# Patient Record
Sex: Female | Born: 1960 | Race: White | Hispanic: No | Marital: Married | Smoking: Never smoker
Health system: Southern US, Community
[De-identification: ages and names within clinical notes are randomized; demographics above are authoritative.]

---

## 2000-06-01 ENCOUNTER — Encounter: Admission: RE | Admit: 2000-06-01 | Discharge: 2000-06-01 | Payer: Self-pay | Admitting: Otolaryngology

## 2000-06-01 ENCOUNTER — Encounter: Payer: Self-pay | Admitting: Otolaryngology

## 2000-12-25 ENCOUNTER — Other Ambulatory Visit: Admission: RE | Admit: 2000-12-25 | Discharge: 2000-12-25 | Payer: Self-pay | Admitting: Family Medicine

## 2002-01-09 ENCOUNTER — Other Ambulatory Visit: Admission: RE | Admit: 2002-01-09 | Discharge: 2002-01-09 | Payer: Self-pay | Admitting: Family Medicine

## 2003-02-03 ENCOUNTER — Other Ambulatory Visit: Admission: RE | Admit: 2003-02-03 | Discharge: 2003-02-03 | Payer: Self-pay | Admitting: Obstetrics and Gynecology

## 2003-02-11 ENCOUNTER — Ambulatory Visit (HOSPITAL_COMMUNITY): Admission: RE | Admit: 2003-02-11 | Discharge: 2003-02-11 | Payer: Self-pay | Admitting: Obstetrics and Gynecology

## 2003-02-11 ENCOUNTER — Encounter: Payer: Self-pay | Admitting: Obstetrics and Gynecology

## 2003-05-05 ENCOUNTER — Ambulatory Visit (HOSPITAL_COMMUNITY): Admission: RE | Admit: 2003-05-05 | Discharge: 2003-05-05 | Payer: Self-pay | Admitting: Obstetrics and Gynecology

## 2003-05-05 ENCOUNTER — Encounter (INDEPENDENT_AMBULATORY_CARE_PROVIDER_SITE_OTHER): Payer: Self-pay | Admitting: Specialist

## 2003-10-05 ENCOUNTER — Ambulatory Visit (HOSPITAL_COMMUNITY): Admission: RE | Admit: 2003-10-05 | Discharge: 2003-10-05 | Payer: Self-pay | Admitting: Surgery

## 2003-10-21 ENCOUNTER — Ambulatory Visit (HOSPITAL_COMMUNITY): Admission: RE | Admit: 2003-10-21 | Discharge: 2003-10-21 | Payer: Self-pay | Admitting: Surgery

## 2003-10-21 ENCOUNTER — Encounter (INDEPENDENT_AMBULATORY_CARE_PROVIDER_SITE_OTHER): Payer: Self-pay | Admitting: *Deleted

## 2004-04-05 ENCOUNTER — Other Ambulatory Visit: Admission: RE | Admit: 2004-04-05 | Discharge: 2004-04-05 | Payer: Self-pay | Admitting: Obstetrics and Gynecology

## 2004-10-06 ENCOUNTER — Ambulatory Visit (HOSPITAL_COMMUNITY): Admission: RE | Admit: 2004-10-06 | Discharge: 2004-10-06 | Payer: Self-pay | Admitting: Surgery

## 2005-05-18 ENCOUNTER — Other Ambulatory Visit: Admission: RE | Admit: 2005-05-18 | Discharge: 2005-05-18 | Payer: Self-pay | Admitting: Obstetrics and Gynecology

## 2007-09-06 ENCOUNTER — Ambulatory Visit (HOSPITAL_COMMUNITY): Admission: RE | Admit: 2007-09-06 | Discharge: 2007-09-06 | Payer: Self-pay | Admitting: Obstetrics and Gynecology

## 2008-02-14 ENCOUNTER — Encounter (INDEPENDENT_AMBULATORY_CARE_PROVIDER_SITE_OTHER): Payer: Self-pay | Admitting: Surgery

## 2008-02-14 ENCOUNTER — Ambulatory Visit (HOSPITAL_COMMUNITY): Admission: RE | Admit: 2008-02-14 | Discharge: 2008-02-15 | Payer: Self-pay | Admitting: Surgery

## 2009-08-18 ENCOUNTER — Encounter: Admission: RE | Admit: 2009-08-18 | Discharge: 2009-08-18 | Payer: Self-pay | Admitting: Obstetrics and Gynecology

## 2010-10-18 NOTE — Op Note (Signed)
NAMEJERRICA, [name]             ACCOUNT NO.:  [id number]   MEDICAL RECORD NO.:  [id number]          PATIENT TYPE:  OIB   LOCATION:  1523                         FACILITY:  [facility]   PHYSICIAN:  Velora Heckler, MD      DATE OF BIRTH:  [birth year]   DATE OF PROCEDURE:  02/14/2008  DATE OF DISCHARGE:  02/15/2008                               OPERATIVE REPORT   PREOPERATIVE DIAGNOSIS:  Bilateral thyroid nodules.   POSTOPERATIVE DIAGNOSIS:  Bilateral thyroid nodules.   PROCEDURE:  Total thyroidectomy.   SURGEON:  Velora Heckler, MD, FACS   ASSISTANT SURGEON  Claud Kelp, MD, FACS   ANESTHESIA:  General per Dr. Eilene Ghazi.   ESTIMATED BLOOD LOSS:  Minimal.   PREPARATION:  Betadine.   COMPLICATIONS:  None.   INDICATIONS:  The patient is a 50 year old white female followed  intermittently since 2004 with bilateral thyroid nodules.  The patient  has been on thyroid hormone suppression.  The thyroid nodules have  continued to enlarge.  The patient was recommended for repeat biopsy.  However, the patient desired surgical resection rather then recurrent  biopsies.   DESCRIPTION OF PROCEDURE:  The procedure was done in OR #6 at the [facility].  The patient was brought to the operating room,  placed in a supine position on the operating room table.  Following  administration of general anesthesia, the patient was positioned and  then prepped and draped in the usual strict aseptic fashion.  After  ascertaining that an adequate level of anesthesia had been achieved, a  Kocher incision was made a #15 blade.  Dissection was carried down  through subcutaneous tissues and platysma.  Hemostasis was obtained with  electrocautery.  Skin flaps were elevated cephalad and caudad from the  thyroid notch to the sternal notch.  A Mahorner self-retaining retractor  was placed for exposure.  Strap muscles were incised in the midline.  Dissection was begun on the left side.   The left thyroid lobe was  exposed.  Strap muscles were reflected laterally.  Venous tributaries  were divided between Ligaclips with the Harmonic scalpel.  The superior  pole was dissected out.  Superior pole vessels were divided between  medium Ligaclips with the Harmonic scalpel.  The gland was gently  mobilized and rolled anteriorly.  Parathyroid tissue was identified and  preserved on its vascular pedicle.  There was a large posterior nodule  on the left side measuring at least 3 cm in diameter.  This was gently  mobilized and included with the resection.  The recurrent nerve was  identified and preserved.  Branches of the inferior thyroid artery were  divided between small Ligaclips.  The ligament of Allyson Sabal was transected  and the gland was mobilized up and onto the anterior trachea.  There was  a moderate size pyramidal lobe which was also excised off the anterior  thyroid cartilage with electrocautery and included en bloc with the  specimen.  The isthmus was mobilized across the midline.  A dry pack was  placed in the left neck.   Next we  turned our attention to the right thyroid lobe.  Strap muscles  were again reflected laterally.  Venous tributaries were divided between  medium Ligaclips with the Harmonic scalpel.  A large inferior venous  tributary was ligated with 3-0 silk ties and divided with the Harmonic  scalpel.  The superior pole was dissected out.  The right lobe was  slightly larger than the left.  It contained multiple nodules.  The  superior pole vessels were divided between medium Ligaclips with the  Harmonic scalpel.  The gland was rolled anteriorly.  Two parathyroid  glands were identified on either side of the inferior thyroid artery.  These were gently mobilized and preserved on their vascular pedicle.  Vessels were divided between medium Ligaclips with the Harmonic scalpel.  Branches of the inferior thyroid artery were divided between small  Ligaclips.  The  gland was rolled anteriorly.  The recurrent nerve was  identified and preserved.  The ligament of Allyson Sabal was transected with  electrocautery and the gland was mobilized up and onto the anterior  trachea.  The gland was completely excised using electrocautery and the  Harmonic scalpel.  Sutures used to mark the right superior pole.  The  entire thyroid was then submitted to pathology for review.  The neck was  irrigated with warm saline.  Surgicel was placed in the operative field  bilaterally.  Strap muscles were reapproximated midline with interrupted  3-0 Vicryl sutures.  Platysma was closed with interrupted 3-0 Vicryl  sutures.  Skin was closed with a running 4-0 Monocryl subcuticular  suture.  The wound was washed and dried and benzoin and Steri-Strips  were applied.  Sterile dressings were applied.  The patient was awakened  from anesthesia and brought to the recovery room in stable condition.  The patient tolerated the procedure well.      Velora Heckler, MD  Electronically Signed     TMG/MEDQ  D:  02/14/2008  T:  02/15/2008  Job:  [id number]   cc:   Juluis Mire, M.D.  Fax: [phone]   Dellis Anes. Idell Pickles, M.D.  Fax: [phone]   Velora Heckler, MD  [phone] [address] [place]  [place] [address]

## 2010-10-21 NOTE — H&P (Signed)
NAME:  TIFFANNY, LAMARCHE NO.:  [id number]   MEDICAL RECORD NO.:  [id number]                   PATIENT TYPE:   LOCATION:                                       FACILITY:   PHYSICIAN:  Juluis Mire, M.D.                DATE OF BIRTH:   DATE OF ADMISSION:  DATE OF DISCHARGE:                                HISTORY & PHYSICAL   HISTORY OF PRESENT ILLNESS:  The patient is a 50 year old gravida 2, para 2,  married white female who presents for hysteroscopy along with diagnostic  laparoscopic laser stand-by.   In relation to the present admission cycles are every 28 days.  She has five  to seven days of flow.  Four days being heavy, changing pads and tampons  every three hours with clots.  She has fairly significant pelvic pain and  discomfort during this time.  This has been unresponsive to over-the-counter  management.  She underwent a previous ultrasound.  It did reveal a left  ovarian cyst measuring approximately 4 cm.  Had diffuse internal echoes  consistent with possible endometrioma versus hemorrhagic cyst.  There is  also a possibility of a small polyp noted.  Follow up ultrasound continued  to reveal cystic enlargement of the left ovary, basically unchanged.  In  view of this she is going to proceed with the above noted surgery for  management of menorrhagia, dyspareunia and persistent cystic enlargement of  the left ovary.  Presumptive diagnosis is endometrial polyp as well as  endometriosis.   ALLERGIES:  No known drug allergies.   MEDICATIONS:  1. Synthroid.  2. Zyrtec.   PAST MEDICAL HISTORY:  She has been under evaluation by Dr. Georgana Curio for  evaluation of thyroid enlargement.  She is presently on Synthroid for  suppression of the thyroid.  Otherwise she has had usual childhood diseases  without any significant sequelae.   PAST SURGICAL HISTORY:  Cryotherapy.   PAST OBSTETRICAL HISTORY:  Two spontaneous vaginal deliveries.   FAMILY  HISTORY:  Mother has history of breast cancer at age 4.  There is a  history of hypertension in the family.   SOCIAL HISTORY:  Reveals occasional alcohol.  No tobacco use.   REVIEW OF SYSTEMS:  Noncontributory.   PHYSICAL EXAMINATION:  VITAL SIGNS:  The patient is afebrile with stable  vital signs.  HEENT:  Normocephalic.  Pupils are equal, round and reactive to light and  accommodation. Extraocular movements are intact.  Sclerae and conjunctivae  clear.  Oropharynx clear.  NECK:  There is continued thyroid enlargement with a pea-sized nodule  located on the left side and a smaller density on the right side.  LUNGS:  Clear.  CARDIAC:  Regular rate and rhythm, no murmurs or gallops.  BREASTS:  No masses palpable.  ABDOMEN:  Exam is benign, no mass, organomegaly or tenderness.  PELVIC:  Normal external genitalia, vaginal mucosa is  clear.  Cervix  unremarkable.  Uterus normal size, shape and contour.  Adnexae free of  masses or tenderness.  EXTREMITIES:  Trace edema.  NEUROLOGIC:  Exam is grossly within normal limits.   IMPRESSION:  1. Menorrhagia with possible polyp.  2. Pelvic pain and cystic enlargement of left ovary, possible endometrioma.   PLAN:  The patient will undergo the above noted surgery.  The risks of  surgery have been discussed including the risk of anesthetics.  The risk of  infection.  The risk of hemorrhage could require transfusion and possible  exploratory surgery.  The risk of injury to adjacent organs requiring  further exploratory surgery.  The risk of deep venous thrombosis and  pulmonary embolus.  The patient expressed her understanding of indications  and risks.                                               Juluis Mire, M.D.    JSM/MEDQ  D:  05/04/2003  T:  05/04/2003  Job:  [id number]

## 2011-03-08 LAB — CBC
HCT: 35.6 — ABNORMAL LOW
Hemoglobin: 12
MCV: 85.9
WBC: 6.4

## 2011-03-08 LAB — URINALYSIS, ROUTINE W REFLEX MICROSCOPIC
Glucose, UA: NEGATIVE
Hgb urine dipstick: NEGATIVE
Ketones, ur: NEGATIVE
Nitrite: NEGATIVE
Protein, ur: NEGATIVE
pH: 5.5

## 2011-03-08 LAB — DIFFERENTIAL
Basophils Absolute: 0
Eosinophils Absolute: 0.2
Lymphocytes Relative: 31
Monocytes Relative: 7

## 2011-03-08 LAB — COMPREHENSIVE METABOLIC PANEL
AST: 20
Albumin: 4
BUN: 10
Calcium: 9.6
Chloride: 106
Creatinine, Ser: 0.87
GFR calc non Af Amer: >60
Glucose, Bld: 109 — ABNORMAL HIGH
Potassium: 4.2
Total Bilirubin: 0.7

## 2011-03-08 LAB — PREGNANCY, URINE: Preg Test, Ur: NEGATIVE

## 2011-03-08 LAB — CALCIUM
Calcium: 8.8
Calcium: 9.2

## 2011-03-08 LAB — PROTIME-INR: INR: 1.1

## 2013-02-19 ENCOUNTER — Other Ambulatory Visit: Payer: Self-pay | Admitting: Obstetrics and Gynecology

## 2013-02-19 DIAGNOSIS — Z803 Family history of malignant neoplasm of breast: Secondary | ICD-10-CM

## 2013-03-13 ENCOUNTER — Ambulatory Visit
Admission: RE | Admit: 2013-03-13 | Discharge: 2013-03-13 | Disposition: A | Discharge status: Home / Self Care | Payer: BC Managed Care – PPO | Source: Ambulatory Visit | Attending: Obstetrics and Gynecology | Admitting: Obstetrics and Gynecology

## 2013-03-13 DIAGNOSIS — Z803 Family history of malignant neoplasm of breast: Secondary | ICD-10-CM

## 2013-03-13 MED ORDER — GADOBENATE DIMEGLUMINE 529 MG/ML IV SOLN
17.0000 mL | Freq: Once | INTRAVENOUS | Status: AC | PRN
  Administered 2013-03-13: 17 mL via INTRAVENOUS

## 2013-03-26 ENCOUNTER — Other Ambulatory Visit: Payer: Self-pay | Admitting: Obstetrics and Gynecology

## 2013-03-26 DIAGNOSIS — R928 Other abnormal and inconclusive findings on diagnostic imaging of breast: Secondary | ICD-10-CM

## 2013-04-02 ENCOUNTER — Ambulatory Visit
Admission: RE | Admit: 2013-04-02 | Discharge: 2013-04-02 | Disposition: A | Discharge status: Home / Self Care | Payer: BC Managed Care – PPO | Source: Ambulatory Visit | Attending: Obstetrics and Gynecology | Admitting: Obstetrics and Gynecology

## 2013-04-02 ENCOUNTER — Other Ambulatory Visit: Payer: Self-pay | Admitting: Obstetrics and Gynecology

## 2013-04-02 DIAGNOSIS — R928 Other abnormal and inconclusive findings on diagnostic imaging of breast: Secondary | ICD-10-CM

## 2013-04-17 ENCOUNTER — Ambulatory Visit: Payer: BC Managed Care – PPO

## 2013-04-17 ENCOUNTER — Other Ambulatory Visit: Payer: Self-pay | Admitting: Obstetrics and Gynecology

## 2013-04-17 ENCOUNTER — Ambulatory Visit
Admission: RE | Admit: 2013-04-17 | Discharge: 2013-04-17 | Disposition: A | Discharge status: Home / Self Care | Payer: BC Managed Care – PPO | Source: Ambulatory Visit | Attending: Obstetrics and Gynecology | Admitting: Obstetrics and Gynecology

## 2013-04-17 DIAGNOSIS — R928 Other abnormal and inconclusive findings on diagnostic imaging of breast: Secondary | ICD-10-CM

## 2013-04-17 MED ORDER — GADOBENATE DIMEGLUMINE 529 MG/ML IV SOLN
17.0000 mL | Freq: Once | INTRAVENOUS | Status: AC | PRN
  Administered 2013-04-17: 17 mL via INTRAVENOUS

## 2013-08-22 ENCOUNTER — Other Ambulatory Visit: Payer: Self-pay | Admitting: Obstetrics and Gynecology

## 2013-08-22 DIAGNOSIS — R928 Other abnormal and inconclusive findings on diagnostic imaging of breast: Secondary | ICD-10-CM

## 2013-10-08 ENCOUNTER — Ambulatory Visit
Admission: RE | Admit: 2013-10-08 | Discharge: 2013-10-08 | Disposition: A | Discharge status: Home / Self Care | Payer: BC Managed Care – PPO | Source: Ambulatory Visit | Attending: Obstetrics and Gynecology | Admitting: Obstetrics and Gynecology

## 2013-10-08 DIAGNOSIS — R928 Other abnormal and inconclusive findings on diagnostic imaging of breast: Secondary | ICD-10-CM

## 2013-10-08 MED ORDER — GADOBENATE DIMEGLUMINE 529 MG/ML IV SOLN
18.0000 mL | Freq: Once | INTRAVENOUS | Status: AC | PRN
  Administered 2013-10-08: 18 mL via INTRAVENOUS

## 2014-03-04 ENCOUNTER — Other Ambulatory Visit: Payer: Self-pay | Admitting: Obstetrics and Gynecology

## 2014-03-05 LAB — CYTOLOGY - PAP

## 2014-03-11 ENCOUNTER — Other Ambulatory Visit: Payer: Self-pay | Admitting: Obstetrics and Gynecology

## 2014-03-11 DIAGNOSIS — Z803 Family history of malignant neoplasm of breast: Secondary | ICD-10-CM

## 2014-04-13 ENCOUNTER — Other Ambulatory Visit: Payer: BC Managed Care – PPO

## 2014-04-14 ENCOUNTER — Ambulatory Visit
Admission: RE | Admit: 2014-04-14 | Discharge: 2014-04-14 | Disposition: A | Discharge status: Home / Self Care | Payer: BC Managed Care – PPO | Source: Ambulatory Visit | Attending: Obstetrics and Gynecology | Admitting: Obstetrics and Gynecology

## 2014-04-14 DIAGNOSIS — Z803 Family history of malignant neoplasm of breast: Secondary | ICD-10-CM

## 2014-04-14 MED ORDER — GADOBENATE DIMEGLUMINE 529 MG/ML IV SOLN
17.0000 mL | Freq: Once | INTRAVENOUS | Status: AC | PRN
  Administered 2014-04-14: 17 mL via INTRAVENOUS

## 2015-03-09 ENCOUNTER — Other Ambulatory Visit: Payer: Self-pay | Admitting: Obstetrics and Gynecology

## 2015-03-10 LAB — CYTOLOGY - PAP

## 2015-11-24 ENCOUNTER — Other Ambulatory Visit: Payer: Self-pay | Admitting: Obstetrics and Gynecology

## 2015-11-24 DIAGNOSIS — Z803 Family history of malignant neoplasm of breast: Secondary | ICD-10-CM

## 2015-12-22 ENCOUNTER — Ambulatory Visit
Admission: RE | Admit: 2015-12-22 | Discharge: 2015-12-22 | Disposition: A | Discharge status: Home / Self Care | Payer: 59 | Source: Ambulatory Visit | Attending: Obstetrics and Gynecology | Admitting: Obstetrics and Gynecology

## 2015-12-22 DIAGNOSIS — Z803 Family history of malignant neoplasm of breast: Secondary | ICD-10-CM

## 2015-12-22 MED ORDER — GADOBENATE DIMEGLUMINE 529 MG/ML IV SOLN: 16.0000 mL | Freq: Once | INTRAVENOUS | Status: DC | PRN

## 2016-06-09 DIAGNOSIS — H5203 Hypermetropia, bilateral: Secondary | ICD-10-CM | POA: Diagnosis not present

## 2016-08-09 DIAGNOSIS — D225 Melanocytic nevi of trunk: Secondary | ICD-10-CM | POA: Diagnosis not present

## 2016-08-09 DIAGNOSIS — L821 Other seborrheic keratosis: Secondary | ICD-10-CM | POA: Diagnosis not present

## 2016-08-09 DIAGNOSIS — D2261 Melanocytic nevi of right upper limb, including shoulder: Secondary | ICD-10-CM | POA: Diagnosis not present

## 2016-08-09 DIAGNOSIS — C44519 Basal cell carcinoma of skin of other part of trunk: Secondary | ICD-10-CM | POA: Diagnosis not present

## 2016-09-05 DIAGNOSIS — C44519 Basal cell carcinoma of skin of other part of trunk: Secondary | ICD-10-CM | POA: Diagnosis not present

## 2016-09-29 DIAGNOSIS — E7800 Pure hypercholesterolemia, unspecified: Secondary | ICD-10-CM | POA: Diagnosis not present

## 2016-09-29 DIAGNOSIS — E039 Hypothyroidism, unspecified: Secondary | ICD-10-CM | POA: Diagnosis not present

## 2016-09-29 DIAGNOSIS — E559 Vitamin D deficiency, unspecified: Secondary | ICD-10-CM | POA: Diagnosis not present

## 2016-11-16 DIAGNOSIS — Z1231 Encounter for screening mammogram for malignant neoplasm of breast: Secondary | ICD-10-CM | POA: Diagnosis not present

## 2016-12-29 DIAGNOSIS — E7800 Pure hypercholesterolemia, unspecified: Secondary | ICD-10-CM | POA: Diagnosis not present

## 2016-12-29 DIAGNOSIS — E039 Hypothyroidism, unspecified: Secondary | ICD-10-CM | POA: Diagnosis not present

## 2017-03-30 DIAGNOSIS — E7800 Pure hypercholesterolemia, unspecified: Secondary | ICD-10-CM | POA: Diagnosis not present

## 2017-04-03 DIAGNOSIS — Z01419 Encounter for gynecological examination (general) (routine) without abnormal findings: Secondary | ICD-10-CM | POA: Diagnosis not present

## 2017-04-05 ENCOUNTER — Other Ambulatory Visit: Payer: Self-pay | Admitting: Obstetrics and Gynecology

## 2017-04-05 DIAGNOSIS — Z803 Family history of malignant neoplasm of breast: Secondary | ICD-10-CM

## 2017-04-16 DIAGNOSIS — J0110 Acute frontal sinusitis, unspecified: Secondary | ICD-10-CM | POA: Diagnosis not present

## 2017-05-09 ENCOUNTER — Other Ambulatory Visit: Payer: 59

## 2017-05-14 ENCOUNTER — Other Ambulatory Visit: Payer: 59

## 2017-05-20 ENCOUNTER — Ambulatory Visit
Admission: RE | Admit: 2017-05-20 | Discharge: 2017-05-20 | Disposition: A | Discharge status: Home / Self Care | Payer: 59 | Source: Ambulatory Visit | Attending: Obstetrics and Gynecology | Admitting: Obstetrics and Gynecology

## 2017-05-20 DIAGNOSIS — Z803 Family history of malignant neoplasm of breast: Secondary | ICD-10-CM | POA: Diagnosis not present

## 2017-05-20 MED ORDER — GADOBENATE DIMEGLUMINE 529 MG/ML IV SOLN
16.0000 mL | Freq: Once | INTRAVENOUS | Status: AC | PRN
  Administered 2017-05-20: 16 mL via INTRAVENOUS

## 2017-06-14 DIAGNOSIS — H5203 Hypermetropia, bilateral: Secondary | ICD-10-CM | POA: Diagnosis not present

## 2017-06-26 DIAGNOSIS — E559 Vitamin D deficiency, unspecified: Secondary | ICD-10-CM | POA: Diagnosis not present

## 2017-06-26 DIAGNOSIS — E039 Hypothyroidism, unspecified: Secondary | ICD-10-CM | POA: Diagnosis not present

## 2017-06-26 DIAGNOSIS — E7800 Pure hypercholesterolemia, unspecified: Secondary | ICD-10-CM | POA: Diagnosis not present

## 2017-08-09 DIAGNOSIS — L821 Other seborrheic keratosis: Secondary | ICD-10-CM | POA: Diagnosis not present

## 2017-08-09 DIAGNOSIS — Z85828 Personal history of other malignant neoplasm of skin: Secondary | ICD-10-CM | POA: Diagnosis not present

## 2017-08-09 DIAGNOSIS — D2362 Other benign neoplasm of skin of left upper limb, including shoulder: Secondary | ICD-10-CM | POA: Diagnosis not present

## 2017-09-28 DIAGNOSIS — E7800 Pure hypercholesterolemia, unspecified: Secondary | ICD-10-CM | POA: Diagnosis not present

## 2017-10-17 DIAGNOSIS — L237 Allergic contact dermatitis due to plants, except food: Secondary | ICD-10-CM | POA: Diagnosis not present

## 2017-11-02 DIAGNOSIS — H3589 Other specified retinal disorders: Secondary | ICD-10-CM | POA: Diagnosis not present

## 2017-11-02 DIAGNOSIS — H34211 Partial retinal artery occlusion, right eye: Secondary | ICD-10-CM | POA: Diagnosis not present

## 2017-11-20 DIAGNOSIS — Z1231 Encounter for screening mammogram for malignant neoplasm of breast: Secondary | ICD-10-CM | POA: Diagnosis not present

## 2017-11-27 DIAGNOSIS — R197 Diarrhea, unspecified: Secondary | ICD-10-CM | POA: Diagnosis not present

## 2017-11-27 DIAGNOSIS — E7800 Pure hypercholesterolemia, unspecified: Secondary | ICD-10-CM | POA: Diagnosis not present

## 2017-11-27 DIAGNOSIS — E039 Hypothyroidism, unspecified: Secondary | ICD-10-CM | POA: Diagnosis not present

## 2017-12-31 DIAGNOSIS — E7800 Pure hypercholesterolemia, unspecified: Secondary | ICD-10-CM | POA: Diagnosis not present

## 2018-01-29 DIAGNOSIS — E039 Hypothyroidism, unspecified: Secondary | ICD-10-CM | POA: Diagnosis not present

## 2018-02-11 DIAGNOSIS — E039 Hypothyroidism, unspecified: Secondary | ICD-10-CM | POA: Diagnosis not present

## 2018-02-11 DIAGNOSIS — Z23 Encounter for immunization: Secondary | ICD-10-CM | POA: Diagnosis not present

## 2018-02-11 DIAGNOSIS — E559 Vitamin D deficiency, unspecified: Secondary | ICD-10-CM | POA: Diagnosis not present

## 2018-02-11 DIAGNOSIS — E7800 Pure hypercholesterolemia, unspecified: Secondary | ICD-10-CM | POA: Diagnosis not present

## 2018-04-04 DIAGNOSIS — Z01419 Encounter for gynecological examination (general) (routine) without abnormal findings: Secondary | ICD-10-CM | POA: Diagnosis not present

## 2018-04-04 DIAGNOSIS — Z6824 Body mass index (BMI) 24.0-24.9, adult: Secondary | ICD-10-CM | POA: Diagnosis not present

## 2018-04-08 ENCOUNTER — Other Ambulatory Visit: Payer: Self-pay | Admitting: Obstetrics and Gynecology

## 2018-04-08 DIAGNOSIS — Z803 Family history of malignant neoplasm of breast: Secondary | ICD-10-CM

## 2018-04-09 DIAGNOSIS — E7800 Pure hypercholesterolemia, unspecified: Secondary | ICD-10-CM | POA: Diagnosis not present

## 2018-05-22 ENCOUNTER — Ambulatory Visit
Admission: RE | Admit: 2018-05-22 | Discharge: 2018-05-22 | Disposition: A | Discharge status: Home / Self Care | Payer: 59 | Source: Ambulatory Visit | Attending: Obstetrics and Gynecology | Admitting: Obstetrics and Gynecology

## 2018-05-22 DIAGNOSIS — Z803 Family history of malignant neoplasm of breast: Secondary | ICD-10-CM

## 2018-05-22 DIAGNOSIS — N6489 Other specified disorders of breast: Secondary | ICD-10-CM | POA: Diagnosis not present

## 2018-05-22 MED ORDER — GADOBUTROL 1 MMOL/ML IV SOLN
7.0000 mL | Freq: Once | INTRAVENOUS | Status: AC | PRN
  Administered 2018-05-22: 7 mL via INTRAVENOUS

## 2018-06-07 DIAGNOSIS — H5203 Hypermetropia, bilateral: Secondary | ICD-10-CM | POA: Diagnosis not present

## 2018-06-25 DIAGNOSIS — Z13820 Encounter for screening for osteoporosis: Secondary | ICD-10-CM | POA: Diagnosis not present

## 2018-07-05 DIAGNOSIS — M8580 Other specified disorders of bone density and structure, unspecified site: Secondary | ICD-10-CM | POA: Diagnosis not present

## 2018-08-15 DIAGNOSIS — D2362 Other benign neoplasm of skin of left upper limb, including shoulder: Secondary | ICD-10-CM | POA: Diagnosis not present

## 2018-08-15 DIAGNOSIS — L438 Other lichen planus: Secondary | ICD-10-CM | POA: Diagnosis not present

## 2018-08-15 DIAGNOSIS — Z85828 Personal history of other malignant neoplasm of skin: Secondary | ICD-10-CM | POA: Diagnosis not present

## 2018-08-15 DIAGNOSIS — L570 Actinic keratosis: Secondary | ICD-10-CM | POA: Diagnosis not present

## 2019-04-11 ENCOUNTER — Other Ambulatory Visit: Payer: Self-pay | Admitting: Obstetrics and Gynecology

## 2019-04-11 DIAGNOSIS — Z9189 Other specified personal risk factors, not elsewhere classified: Secondary | ICD-10-CM

## 2019-05-19 ENCOUNTER — Other Ambulatory Visit: Payer: Self-pay

## 2019-05-19 DIAGNOSIS — Z20822 Contact with and (suspected) exposure to covid-19: Secondary | ICD-10-CM

## 2019-05-21 LAB — NOVEL CORONAVIRUS, NAA: SARS-CoV-2, NAA: NOT DETECTED

## 2019-05-26 ENCOUNTER — Other Ambulatory Visit: Payer: 59

## 2019-06-19 ENCOUNTER — Other Ambulatory Visit: Payer: 59

## 2019-08-16 ENCOUNTER — Other Ambulatory Visit: Payer: Self-pay | Admitting: Physician Assistant

## 2019-08-16 DIAGNOSIS — R1310 Dysphagia, unspecified: Secondary | ICD-10-CM

## 2019-08-22 ENCOUNTER — Ambulatory Visit
Admission: RE | Admit: 2019-08-22 | Discharge: 2019-08-22 | Disposition: A | Discharge status: Home / Self Care | Payer: 59 | Source: Ambulatory Visit | Attending: Physician Assistant | Admitting: Physician Assistant

## 2019-08-22 DIAGNOSIS — R1310 Dysphagia, unspecified: Secondary | ICD-10-CM

## 2019-09-05 ENCOUNTER — Ambulatory Visit: Payer: 59 | Attending: Internal Medicine

## 2019-09-05 DIAGNOSIS — Z23 Encounter for immunization: Secondary | ICD-10-CM

## 2019-09-05 NOTE — Progress Notes (Signed)
   Covid-19 Vaccination Clinic  Name:  [name]    MRN: [id number] DOB: [birth year]  09/05/2019  Ms. Hands was observed post Covid-19 immunization for 15 minutes without incident. She was provided with Vaccine Information Sheet and instruction to access the V-Safe system.   [name] was instructed to call 911 with any severe reactions post vaccine: Marland Kitchen Difficulty breathing  . Swelling of face and throat  . A fast heartbeat  . A bad rash all over body  . Dizziness and weakness   Immunizations Administered    Name Date Dose VIS Date Route   Pfizer COVID-19 Vaccine 09/05/2019 11:39 AM 0.3 mL 05/16/2019 Intramuscular   Manufacturer: [place]   Lot: [id number]   [place]: [id number]

## 2019-09-30 ENCOUNTER — Ambulatory Visit: Payer: 59 | Attending: Internal Medicine

## 2019-09-30 DIAGNOSIS — Z23 Encounter for immunization: Secondary | ICD-10-CM

## 2019-09-30 NOTE — Progress Notes (Signed)
   Covid-19 Vaccination Clinic  Name:  [name]    MRN: [id number] DOB: [birth year]  09/30/2019  [name] was observed post Covid-19 immunization for 15 minutes without incident. She was provided with Vaccine Information Sheet and instruction to access the V-Safe system.   [name] was instructed to call 911 with any severe reactions post vaccine: Marland Kitchen Difficulty breathing  . Swelling of face and throat  . A fast heartbeat  . A bad rash all over body  . Dizziness and weakness   Immunizations Administered    Name Date Dose VIS Date Route   Pfizer COVID-19 Vaccine 09/30/2019 11:35 AM 0.3 mL 07/30/2018 Intramuscular   Manufacturer: [place]   Lot: [id number]   [place]: [id number]

## 2019-10-25 IMAGING — MR MR BILATERAL BREAST WITHOUT AND WITH CONTRAST
8 of 12 series · 33 of 48 positions shown · IV contrast (20ml Multihance)
Comparison: Previous exam(s).

CLINICAL DATA: Family history of breast cancer. The patient's
mother had breast cancer at the age of 63.

LABS:  None obtained at the time of imaging.
EXAM:
BILATERAL BREAST MRI WITH AND WITHOUT CONTRAST
TECHNIQUE: Multiplanar, multisequence MR images of both breasts were obtained
prior to and following the intravenous administration of 20 ml of
MultiHance.

[Series 3: t2_tirm_tra ipat (phase · axial · 3.0mm · 0.70mm/px · 1 of 59 slices shown]
[im 1/59]
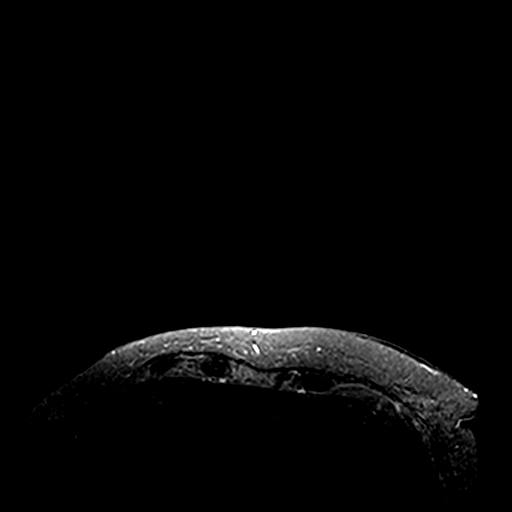

[Series 4: fl3d pre-cm no · axial · non-contrast · 1.2mm · 0.94mm/px · z∈[-24,+147]mm · 5 of 144 slices shown]
[im 1/144]
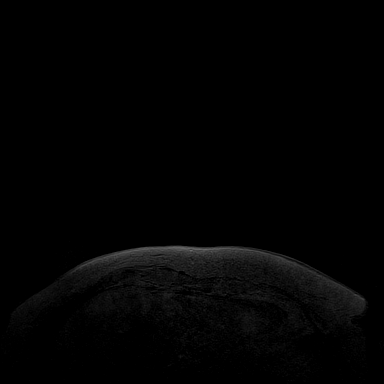
[im 36/144]
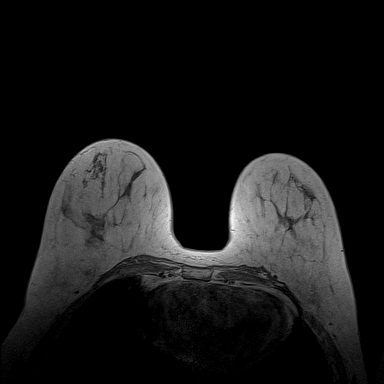
[im 72/144]
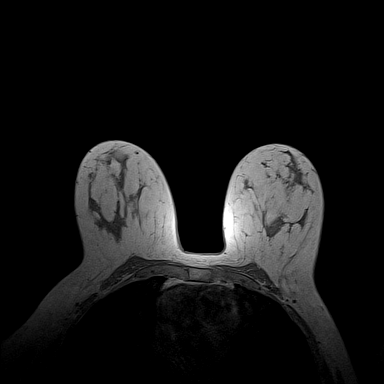
[im 108/144]
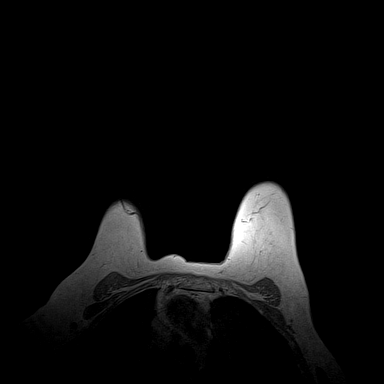
[im 144/144]
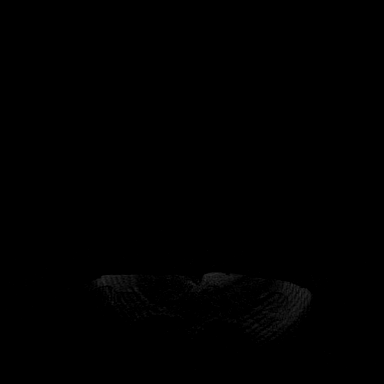

[Series 7: fl3d pre-cm · axial · non-contrast · 1.2mm · 0.94mm/px · z∈[-24,+147]mm · 5 of 144 slices shown]
[im 1/144]
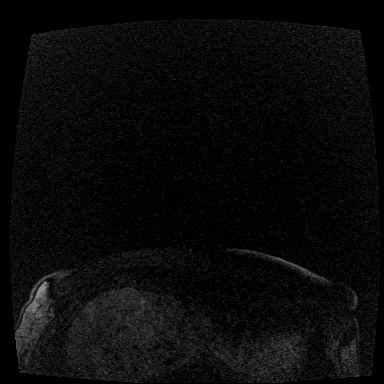
[im 36/144]
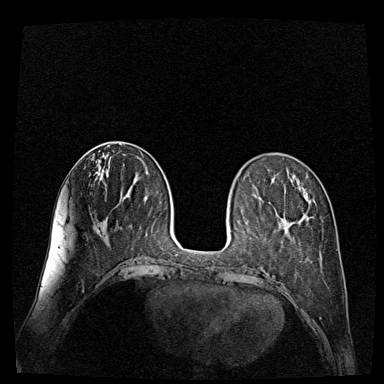
[im 72/144]
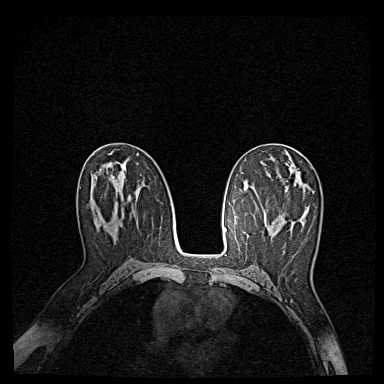
[im 108/144]
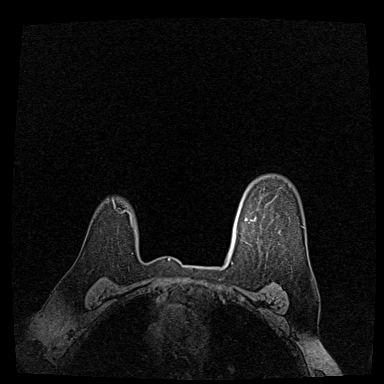
[im 144/144]
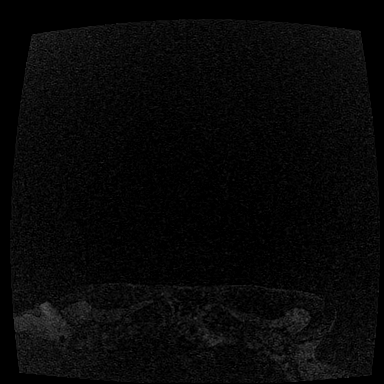

[Series 8: fl3d post-cm 20 · axial · 1.2mm · 0.94mm/px · z∈[-24,+147]mm · 5 of 144 slices shown (1 of 3)]
[im 1/144]
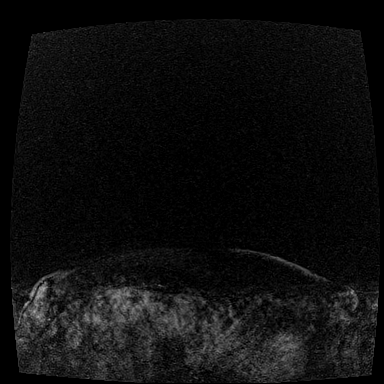
[im 36/144]
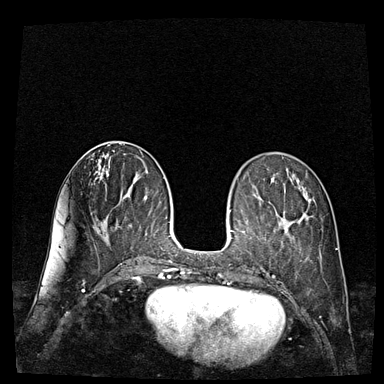
[im 72/144]
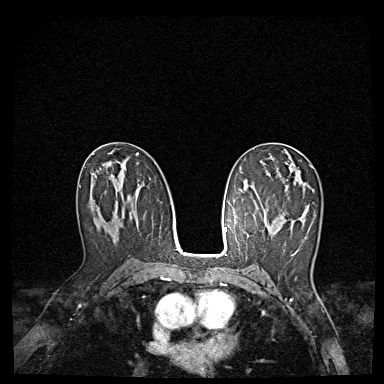
[im 108/144]
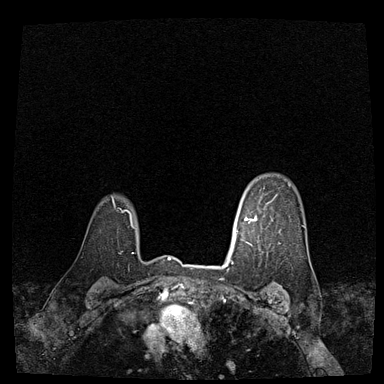
[im 144/144]
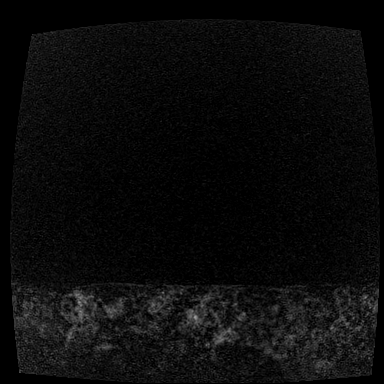

[Series 9: fl3d post-cm 20 · axial · 1.2mm · 0.94mm/px · z∈[-24,+147]mm · 5 of 144 slices shown (2 of 3)]
[im 1/144]
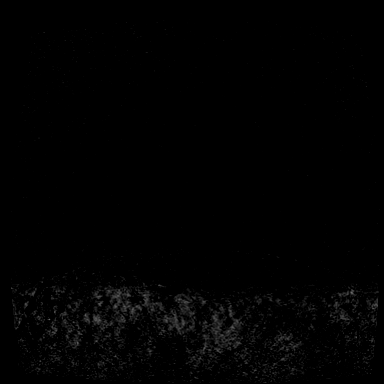
[im 36/144]
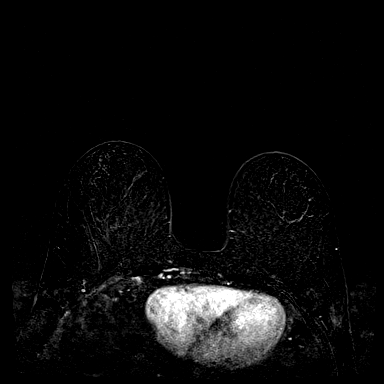
[im 72/144]
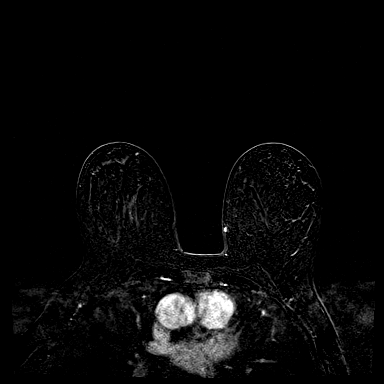
[im 108/144]
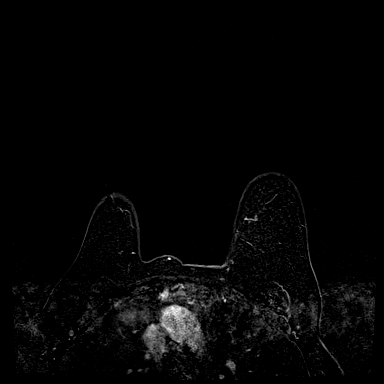
[im 144/144]
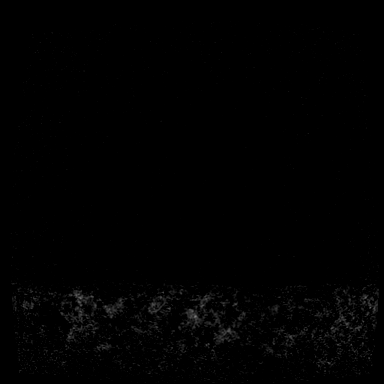

[Series 10: fl3d post-cm 20 · axial · 172.8mm · 0.94mm/px · 1 of 1 slices shown (3 of 3)]
[im 1/1]
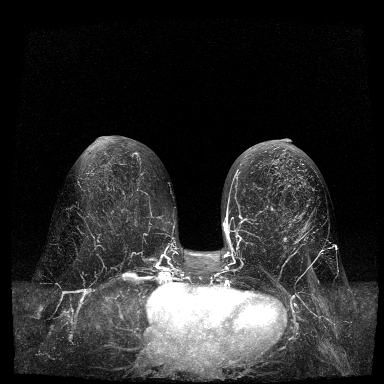

[Series 11: fl3d post-cm 3min · axial · 1.2mm · 0.94mm/px · z∈[-24,+147]mm · 6 of 144 slices shown]
[im 1/144]
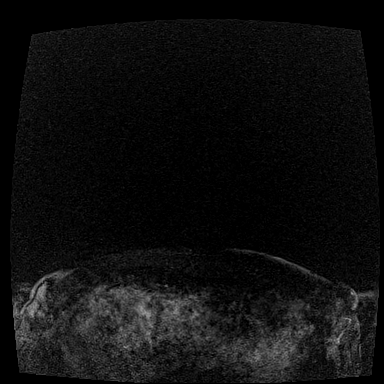
[im 29/144]
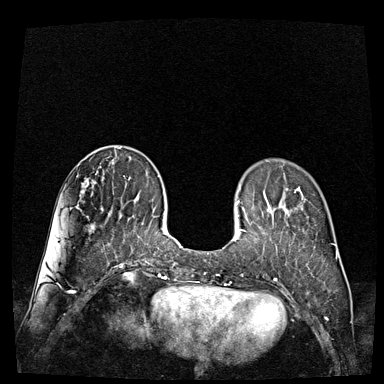
[im 58/144]
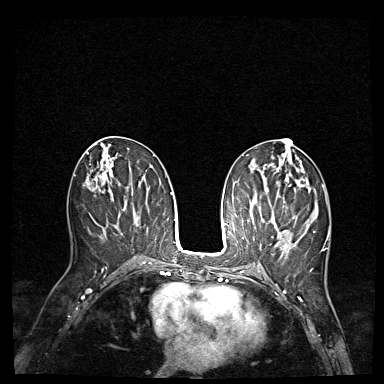
[im 86/144]
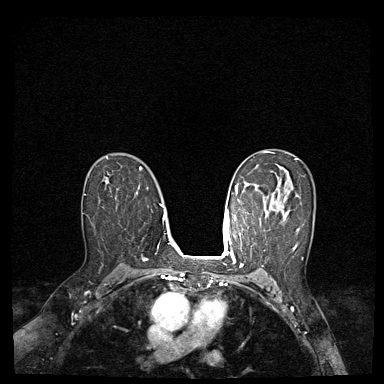
[im 115/144]
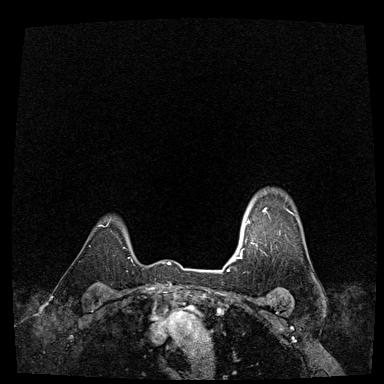
[im 144/144]
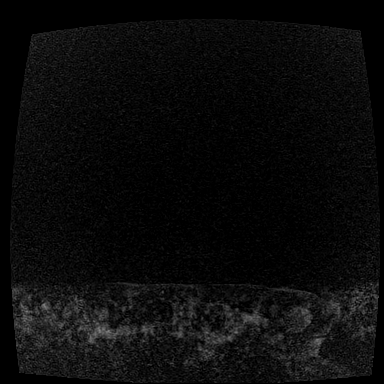

[Series 12: fl3d post-cm 3min_sub · axial · 1.2mm · 0.94mm/px · z∈[-24,+113]mm · 5 of 144 slices shown]
[im 1/144]
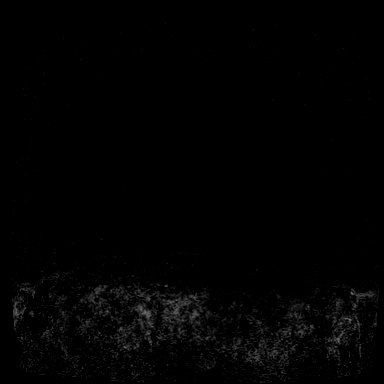
[im 29/144]
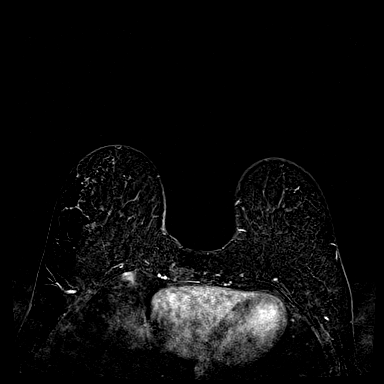
[im 58/144]
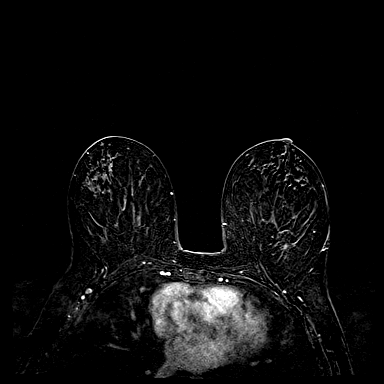
[im 86/144]
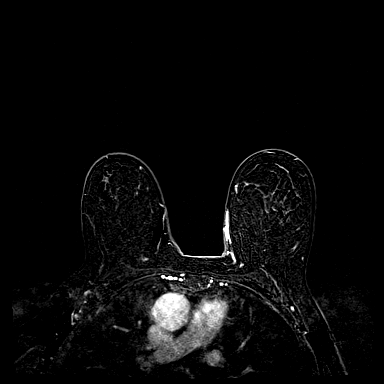
[im 115/144]
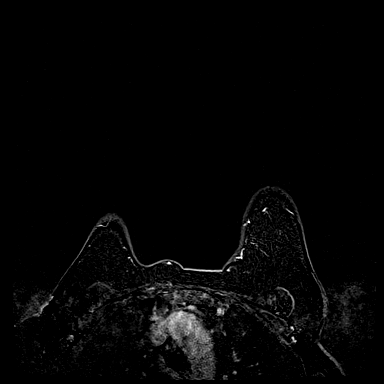

[33 of 48 positions shown; findings below may reference images not displayed]

THREE-DIMENSIONAL MR IMAGE RENDERING ON INDEPENDENT WORKSTATION:

Three-dimensional MR images were rendered by post-processing of the
original MR data on an independent workstation. The
three-dimensional MR images were interpreted, and findings are
reported in the following complete MRI report for this study. Three
dimensional images were evaluated at the independent [employer]
workstation
FINDINGS: Breast composition: b. Scattered fibroglandular tissue.

Background parenchymal enhancement: Moderate.

Right breast: No mass or abnormal enhancement.

Left breast: No mass or abnormal enhancement.

Lymph nodes: No abnormal appearing lymph nodes.

Ancillary findings:  None.
IMPRESSION: No abnormal enhancement in either breast.

RECOMMENDATION:
Bilateral screening mammogram in Friday November, 2017 is recommended.

BI-RADS CATEGORY  1: Negative.

## 2020-04-12 ENCOUNTER — Ambulatory Visit
Admission: RE | Admit: 2020-04-12 | Discharge: 2020-04-12 | Disposition: A | Discharge status: Home / Self Care | Payer: 59 | Source: Ambulatory Visit | Attending: Obstetrics and Gynecology | Admitting: Obstetrics and Gynecology

## 2020-04-12 ENCOUNTER — Other Ambulatory Visit: Payer: Self-pay

## 2020-04-12 DIAGNOSIS — Z9189 Other specified personal risk factors, not elsewhere classified: Secondary | ICD-10-CM

## 2020-04-12 MED ORDER — GADOBUTROL 1 MMOL/ML IV SOLN
8.0000 mL | Freq: Once | INTRAVENOUS | Status: AC | PRN
  Administered 2020-04-12: 8 mL via INTRAVENOUS

## 2020-05-21 ENCOUNTER — Ambulatory Visit: Payer: 59 | Attending: Internal Medicine

## 2020-05-21 DIAGNOSIS — Z23 Encounter for immunization: Secondary | ICD-10-CM

## 2020-05-21 NOTE — Progress Notes (Signed)
   Covid-19 Vaccination Clinic  Name:  [name]    MRN: [id number] DOB: January 01, 1961  05/21/2020  [name] was observed post Covid-19 immunization for 15 minutes without incident. She was provided with Vaccine Information Sheet and instruction to access the V-Safe system.   [name] was instructed to call 911 with any severe reactions post vaccine: Marland Kitchen Difficulty breathing  . Swelling of face and throat  . A fast heartbeat  . A bad rash all over body  . Dizziness and weakness   Immunizations Administered    Name Date Dose VIS Date Route   Moderna Covid-19 Booster Vaccine 05/21/2020  2:09 PM 0.25 mL 03/24/2020 Intramuscular   Manufacturer: Moderna   Lot: [id number]   [place]: [id number]

## 2020-12-27 ENCOUNTER — Other Ambulatory Visit: Payer: Self-pay | Admitting: Obstetrics and Gynecology

## 2020-12-27 DIAGNOSIS — Z803 Family history of malignant neoplasm of breast: Secondary | ICD-10-CM

## 2021-04-11 ENCOUNTER — Ambulatory Visit
Admission: RE | Admit: 2021-04-11 | Discharge: 2021-04-11 | Disposition: A | Discharge status: Home / Self Care | Payer: BC Managed Care – PPO | Source: Ambulatory Visit | Attending: Obstetrics and Gynecology | Admitting: Obstetrics and Gynecology

## 2021-04-11 ENCOUNTER — Other Ambulatory Visit: Payer: Self-pay

## 2021-04-11 DIAGNOSIS — Z1239 Encounter for other screening for malignant neoplasm of breast: Secondary | ICD-10-CM | POA: Diagnosis not present

## 2021-04-11 DIAGNOSIS — Z803 Family history of malignant neoplasm of breast: Secondary | ICD-10-CM

## 2021-04-11 MED ORDER — GADOBUTROL 1 MMOL/ML IV SOLN
8.0000 mL | Freq: Once | INTRAVENOUS | Status: AC | PRN
  Administered 2021-04-11: 8 mL via INTRAVENOUS

## 2021-04-18 DIAGNOSIS — Z01419 Encounter for gynecological examination (general) (routine) without abnormal findings: Secondary | ICD-10-CM | POA: Diagnosis not present

## 2021-04-18 DIAGNOSIS — Z6825 Body mass index (BMI) 25.0-25.9, adult: Secondary | ICD-10-CM | POA: Diagnosis not present

## 2021-04-26 DIAGNOSIS — E039 Hypothyroidism, unspecified: Secondary | ICD-10-CM | POA: Diagnosis not present

## 2021-04-26 DIAGNOSIS — E7800 Pure hypercholesterolemia, unspecified: Secondary | ICD-10-CM | POA: Diagnosis not present

## 2021-04-26 DIAGNOSIS — Z0000 Encounter for general adult medical examination without abnormal findings: Secondary | ICD-10-CM | POA: Diagnosis not present

## 2021-04-26 DIAGNOSIS — E559 Vitamin D deficiency, unspecified: Secondary | ICD-10-CM | POA: Diagnosis not present

## 2021-04-26 DIAGNOSIS — Z23 Encounter for immunization: Secondary | ICD-10-CM | POA: Diagnosis not present

## 2021-05-04 ENCOUNTER — Other Ambulatory Visit: Payer: Self-pay | Admitting: Family Medicine

## 2021-05-04 ENCOUNTER — Ambulatory Visit
Admission: RE | Admit: 2021-05-04 | Discharge: 2021-05-04 | Disposition: A | Discharge status: Home / Self Care | Payer: BC Managed Care – PPO | Source: Ambulatory Visit | Attending: Family Medicine | Admitting: Family Medicine

## 2021-05-04 DIAGNOSIS — M1711 Unilateral primary osteoarthritis, right knee: Secondary | ICD-10-CM | POA: Diagnosis not present

## 2021-05-04 DIAGNOSIS — M7989 Other specified soft tissue disorders: Secondary | ICD-10-CM | POA: Diagnosis not present

## 2021-05-04 DIAGNOSIS — R609 Edema, unspecified: Secondary | ICD-10-CM

## 2021-06-09 DIAGNOSIS — E039 Hypothyroidism, unspecified: Secondary | ICD-10-CM | POA: Diagnosis not present

## 2021-07-22 DIAGNOSIS — E039 Hypothyroidism, unspecified: Secondary | ICD-10-CM | POA: Diagnosis not present

## 2021-07-28 DIAGNOSIS — H04123 Dry eye syndrome of bilateral lacrimal glands: Secondary | ICD-10-CM | POA: Diagnosis not present

## 2021-09-08 DIAGNOSIS — D2261 Melanocytic nevi of right upper limb, including shoulder: Secondary | ICD-10-CM | POA: Diagnosis not present

## 2021-09-08 DIAGNOSIS — L821 Other seborrheic keratosis: Secondary | ICD-10-CM | POA: Diagnosis not present

## 2021-09-08 DIAGNOSIS — C44519 Basal cell carcinoma of skin of other part of trunk: Secondary | ICD-10-CM | POA: Diagnosis not present

## 2021-09-08 DIAGNOSIS — L570 Actinic keratosis: Secondary | ICD-10-CM | POA: Diagnosis not present

## 2021-09-08 DIAGNOSIS — Z85828 Personal history of other malignant neoplasm of skin: Secondary | ICD-10-CM | POA: Diagnosis not present

## 2021-09-08 DIAGNOSIS — L814 Other melanin hyperpigmentation: Secondary | ICD-10-CM | POA: Diagnosis not present

## 2021-11-15 DIAGNOSIS — L255 Unspecified contact dermatitis due to plants, except food: Secondary | ICD-10-CM | POA: Diagnosis not present

## 2022-04-19 DIAGNOSIS — Z124 Encounter for screening for malignant neoplasm of cervix: Secondary | ICD-10-CM | POA: Diagnosis not present

## 2022-04-19 DIAGNOSIS — Z1231 Encounter for screening mammogram for malignant neoplasm of breast: Secondary | ICD-10-CM | POA: Diagnosis not present

## 2022-04-19 DIAGNOSIS — Z6825 Body mass index (BMI) 25.0-25.9, adult: Secondary | ICD-10-CM | POA: Diagnosis not present

## 2022-04-19 DIAGNOSIS — Z1151 Encounter for screening for human papillomavirus (HPV): Secondary | ICD-10-CM | POA: Diagnosis not present

## 2022-04-19 DIAGNOSIS — Z01419 Encounter for gynecological examination (general) (routine) without abnormal findings: Secondary | ICD-10-CM | POA: Diagnosis not present

## 2022-05-02 DIAGNOSIS — Z0000 Encounter for general adult medical examination without abnormal findings: Secondary | ICD-10-CM | POA: Diagnosis not present

## 2022-05-02 DIAGNOSIS — E7800 Pure hypercholesterolemia, unspecified: Secondary | ICD-10-CM | POA: Diagnosis not present

## 2022-05-02 DIAGNOSIS — Z23 Encounter for immunization: Secondary | ICD-10-CM | POA: Diagnosis not present

## 2022-05-02 DIAGNOSIS — E039 Hypothyroidism, unspecified: Secondary | ICD-10-CM | POA: Diagnosis not present

## 2022-05-09 DIAGNOSIS — E7800 Pure hypercholesterolemia, unspecified: Secondary | ICD-10-CM | POA: Diagnosis not present

## 2022-05-09 DIAGNOSIS — E039 Hypothyroidism, unspecified: Secondary | ICD-10-CM | POA: Diagnosis not present

## 2022-07-07 DIAGNOSIS — E039 Hypothyroidism, unspecified: Secondary | ICD-10-CM | POA: Diagnosis not present

## 2022-08-10 DIAGNOSIS — H53143 Visual discomfort, bilateral: Secondary | ICD-10-CM | POA: Diagnosis not present

## 2022-10-28 DIAGNOSIS — L255 Unspecified contact dermatitis due to plants, except food: Secondary | ICD-10-CM | POA: Diagnosis not present

## 2022-11-09 DIAGNOSIS — L570 Actinic keratosis: Secondary | ICD-10-CM | POA: Diagnosis not present

## 2022-11-09 DIAGNOSIS — L821 Other seborrheic keratosis: Secondary | ICD-10-CM | POA: Diagnosis not present

## 2022-11-09 DIAGNOSIS — D2362 Other benign neoplasm of skin of left upper limb, including shoulder: Secondary | ICD-10-CM | POA: Diagnosis not present

## 2022-11-09 DIAGNOSIS — Z85828 Personal history of other malignant neoplasm of skin: Secondary | ICD-10-CM | POA: Diagnosis not present

## 2022-11-09 DIAGNOSIS — D692 Other nonthrombocytopenic purpura: Secondary | ICD-10-CM | POA: Diagnosis not present

## 2022-11-17 DIAGNOSIS — L237 Allergic contact dermatitis due to plants, except food: Secondary | ICD-10-CM | POA: Diagnosis not present

## 2022-12-04 DIAGNOSIS — R682 Dry mouth, unspecified: Secondary | ICD-10-CM | POA: Diagnosis not present

## 2022-12-04 DIAGNOSIS — G479 Sleep disorder, unspecified: Secondary | ICD-10-CM | POA: Diagnosis not present

## 2023-01-26 ENCOUNTER — Ambulatory Visit: Payer: BC Managed Care – PPO | Admitting: Internal Medicine

## 2023-01-26 ENCOUNTER — Encounter: Payer: Self-pay | Admitting: Internal Medicine

## 2023-01-26 ENCOUNTER — Other Ambulatory Visit: Payer: Self-pay

## 2023-01-26 VITALS — BP 120/76 | HR 70 | Temp 98.2°F | Ht 70.0 in | Wt 172.9 lb

## 2023-01-26 DIAGNOSIS — J3089 Other allergic rhinitis: Secondary | ICD-10-CM | POA: Diagnosis not present

## 2023-01-26 DIAGNOSIS — L853 Xerosis cutis: Secondary | ICD-10-CM

## 2023-01-26 DIAGNOSIS — J301 Allergic rhinitis due to pollen: Secondary | ICD-10-CM

## 2023-01-26 DIAGNOSIS — R682 Dry mouth, unspecified: Secondary | ICD-10-CM

## 2023-01-26 DIAGNOSIS — J343 Hypertrophy of nasal turbinates: Secondary | ICD-10-CM | POA: Diagnosis not present

## 2023-01-26 DIAGNOSIS — J3081 Allergic rhinitis due to animal (cat) (dog) hair and dander: Secondary | ICD-10-CM | POA: Diagnosis not present

## 2023-01-26 MED ORDER — FLUTICASONE PROPIONATE 50 MCG/ACT NA SUSP: 2.0000 | Freq: Every day | NASAL | 5 refills | Status: DC

## 2023-01-26 MED ORDER — CETIRIZINE HCL 10 MG PO TABS: 10.0000 mg | ORAL_TABLET | Freq: Every day | ORAL | 5 refills | Status: DC | PRN

## 2023-01-26 MED ORDER — AZELASTINE HCL 0.1 % NA SOLN: 1.0000 | Freq: Two times a day (BID) | NASAL | 5 refills | Status: DC | PRN

## 2023-01-26 NOTE — Progress Notes (Signed)
NEW PATIENT  Date of Service/Encounter:  01/26/23  Consult requested by: Jarrett Soho, PA-C   Subjective:   [name] (DOB: 03-02-1961) is a 62 y.o. female who presents to the clinic on 01/26/2023 with a chief complaint of Allergies (She is always having dry mouth. She has been taking zyrtec all through her live) and Establish Care .    History obtained from: chart review and patient.   Rhinitis:  Started since middle school.  Symptoms include:  headaches, nasal congestion, rhinorrhea, post nasal drainage, and sneezing  Occurs year-round with seasonal flares Spring and Fall   Potential triggers: not sure   Treatments tried:  Did allergy shots at home when she was young Zyrtec  Takes Tylenol PM for sleep  Tylenol PM and Zyrtec; tried spacing out the zyrtec to reduce anti histamines in addition to discontinuation of Tylenol PM for 1 month but still with some dry mouth.  Flonase does seem to help  Last use of anti histamines was Tuesday   Previous allergy testing: yes in college but can't recall results History of reflux/heartburn: no History of sinus surgery: no Nonallergic triggers: none   Dry Mouth: Started about a year ago.  Has had cavities in the past but nothing recently.  Worse at nighttime.  Does drink lots of water.  Some itchy eyes but no dryness. Sometimes with dry skin.  No autoimmune disease; she is on levothyroxine after thyroid removal for a nodule.  Does not have small joint issues; does sometimes have knee pain due to working out/running.   Past Medical History: History reviewed. No pertinent past medical history.  Past Surgical History: History reviewed. No pertinent surgical history.  Family History: Family History  Problem Relation Age of Onset   Allergic rhinitis Mother     Social History:  Flooring in bedroom: carpet Pets: none Tobacco use/exposure: none Job: retired   Medication List:  Allergies as of 01/26/2023   Not on  File      Medication List        Accurate as of January 26, 2023 12:20 PM. If you have any questions, ask your nurse or doctor.          atorvastatin 10 MG tablet Commonly known as: LIPITOR Take 10 mg by mouth daily.   CALCIUM 1000 + D PO   ELDERBERRY PO Elderberry   fluticasone 50 MCG/ACT nasal spray Commonly known as: FLONASE Place 2 sprays into both nostrils daily.   Iron 325 (65 Fe) MG Tabs   levothyroxine 112 MCG tablet Commonly known as: SYNTHROID Take 112 mcg by mouth daily before breakfast.   MULTI VITAMIN DAILY PO Multi Vitamin   PROBIOTIC 10 ULTRA STRENGTH PO   TYLENOL PM EXTRA STRENGTH PO Take 2 mg by mouth daily.   Vitamin B-12 500 MCG Lozg Vitamin B12   Vitamin D3 50 MCG (2000 UT) Chew Chew by mouth.   ZyrTEC Allergy 10 MG tablet Generic drug: cetirizine Take 10 mg by mouth daily.         REVIEW OF SYSTEMS: Pertinent positives and negatives discussed in HPI.   Objective:   Physical Exam: BP 120/76   Pulse 70   Temp 98.2 F (36.8 C) (Temporal)   Ht 5\' 10"  (1.778 m)   Wt 172 lb 14.4 oz (78.4 kg)   SpO2 100%   BMI 24.81 kg/m  Body mass index is 24.81 kg/m. GEN: alert, well developed HEENT: clear conjunctiva, TM grey and translucent, nose with +  moderate inferior turbinate hypertrophy, pink nasal mucosa, slight clear rhinorrhea, +  cobblestoning HEART: regular rate and rhythm, no murmur LUNGS: clear to auscultation bilaterally, no coughing, unlabored respiration ABDOMEN: soft, non distended  SKIN: no rashes or lesions  Reviewed:  04/18/2021: seen by obgyn for follow up.  Hx of endometriosis, thyroidectomy for thyroid nodule, HLD. Doing well overall. On levothyroxine and lipitor.   08/22/2019: having trouble with swallowing, dry mouth.  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 No masses or strictures. 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   05/05/2021: knee xray shows mild  degenerative changes. No concern for autoimmune changes.   Skin Testing:  Skin prick testing was placed, which includes aeroallergens/foods, histamine control, and saline control.  Verbal consent was obtained prior to placing test.  Patient tolerated procedure well.  Allergy testing results were read and interpreted by myself, documented by clinical staff. Adequate positive and negative control.  Results discussed with patient/family.  Airborne Adult Perc - 01/26/23 0953     Time Antigen Placed 1610    Allergen Manufacturer Waynette Buttery    Location Back    Number of Test 55    1. Control-Buffer 50% Glycerol Negative    2. Control-Histamine 3+    3. Bahia Negative    4. French Southern Territories Negative    5. Johnson Negative    6. [place] Blue Negative    7. Meadow Fescue Negative    8. Perennial Rye Negative    9. Timothy Negative    10. Ragweed Mix Negative    11. Cocklebur Negative    12. Plantain,  English Negative    13. Baccharis Negative    14. Dog Fennel Negative    15. Russian Thistle Negative    16. Lamb's Quarters Negative    17. Sheep Sorrell Negative    18. Rough Pigweed Negative    19. Marsh Elder, Rough Negative    20. Mugwort, Common Negative    21. Box, Elder 3+    22. Cedar, red Negative    23. Sweet Gum Negative    24. Pecan Pollen Negative    25. Pine Mix Negative    26. Walnut, Black Pollen Negative    27. Red Mulberry Negative    28. Ash Mix Negative    29. Birch Mix Negative    30. Beech American Negative    31. Cottonwood, Guinea-Bissau Negative    33. Maple Mix Negative    34. Oak, Guinea-[place] Mix Negative    35. Sycamore Eastern Negative    36. Alternaria Alternata Negative    37. Cladosporium Herbarum Negative    38. Aspergillus Mix Negative    39. Penicillium Mix Negative    40. Bipolaris Sorokiniana (Helminthosporium) Negative    41. Drechslera Spicifera (Curvularia) 3+    42. Mucor Plumbeus Negative    43. Fusarium Moniliforme Negative    44. Aureobasidium  Pullulans (pullulara) Negative    45. Rhizopus Oryzae Negative    46. Botrytis Cinera Negative    47. Epicoccum Nigrum 3+    48. Phoma Betae 3+    49. Dust Mite Mix 3+    50. Cat Hair 10,000 BAU/ml 3+    51.  Dog Epithelia Negative    52. Mixed Feathers 3+    53. Horse Epithelia Negative    54. Cockroach, German Negative    55. Tobacco Leaf Negative             Intradermal - 01/26/23 1021  Time Antigen Placed 1021    Allergen Manufacturer Greer    Location Arm    Number of Test 13    Control Negative    Bahia Negative    French Southern Territories Negative    Johnson 2+    7 Grass Negative    Ragweed Mix 2+    Weed Mix Negative    Tree Mix 2+    Mold 1 2+    Mold 2 3+    Mold 4 3+    Dog Negative    Cockroach Negative    Other Omitted               Assessment:   1. Nasal turbinate hypertrophy   2. Dry skin   3. Dry mouth   4. Seasonal allergic rhinitis due to pollen   5. Allergic rhinitis caused by mold   6. Allergic rhinitis due to dust mite   7. Allergic rhinitis due to animal hair or dander     Plan/Recommendations:  Allergic Rhinitis: - Due to turbinate hypertrophy, seasonal symptoms and unresponsive to over the counter meds, performed skin testing to identify aeroallergen triggers.   - Positive skin test 01/2023: grasses, weeds, trees, molds, dust mite, cats, feathers  - Avoidance measures discussed. - Use nasal saline rinses before nose sprays such as with Neilmed Sinus Rinse.  Use distilled water.   - Use Flonase 2 sprays each nostril daily. Aim upward and outward. - Use Azelastine 1-2 sprays each nostril twice daily as needed for runny nose, drainage, sneezing, congestion. Aim upward and outward. - Use Zyrtec 10 mg daily as needed for runny nose, sneezing, itchy watery eyes.   - Try to wean off Tylenol PM.   - Consider allergy shots as long term control of your symptoms by teaching your immune system to be more tolerant of your allergy triggers.  Please  discuss with insurance company and if interested in starting, let us know.  Will need Epipen.  Also on shot days, please take Zyrtec.    Dry Mouth/Dry Skin - Low suspicion for Sjogren's based on history of no dry eyes, no cavities, no hx of autoimmune disease, no arthritis.   Water Sip water throughout the day.  Humidifier Using a humidifier once or twice a day, especially in your sleeping area at night, can help a lot. Cool or warm mist both work; use what you prefer. There are different kinds of humidifiers, including small, personal models, some with face masks and others that just direct steam in the air near you.  A Water Spray Bottle Keep a spray bottle with water nearby and spray inside your mouth when needed to keep it wet. Adding a few drops of aloe or glycerin to the water can make it last longer or extend the moisturizing effects.  Glycerin (also called glycerol) is an inexpensive, flavorless and nontoxic ingredient you can find at cake decorating stores and online. It is a humectant, which means it attracts and retains moisture. Put a few drops of glycerin in water, swish it around in your mouth and spit it out. Or, make an oral spray: Use four drops of glycerin in a small spray bottle of water (4 ounces) and use as needed -- you don't have to spit it out. (Note: Do not put drops directly on tongue or in mouth. You must dilute them in water.)  Sugarless Candy, Lozenges or Gum Having something in the mouth can trigger natural saliva production. Citrus, cinnamon and mint are  good flavor choices if they are not too acidic or irritating. Look for those with aloe, xylitol, glycerin or other hydrating agents and sugarless gum with baking soda, available at many local drugstores.  More Moisture at Mealtime Sip water between bites when eating. Adding condiments, soups, gravies and sauces can help, too.  Avoid Caffeine and Alcohol Caffeine and alcohol can cause excessive dryness since they  are diuretics.  Alcohol-free Mouthwashes Alcohol can further dry out your mouth. Most kids' mouthwashes are alcohol-free, and many brands have an alcohol-free option.  Saliva Substitutes Ask your pharmacist about over-the-counter products that can come in drops or spray formulas.  Frozen Melon or Cucumber Try a refreshing, water-rich slice between your cheek and gum for one or two hours. If it helps, keep some thinly sliced in a small bag in the freezer and use one or more a day.   ALLERGEN AVOIDANCE MEASURES   Dust Mites Use central air conditioning and heat; and change the filter monthly.  Pleated filters work better than mesh filters.  Electrostatic filters may also be used; wash the filter monthly.  Window air conditioners may be used, but do not clean the air as well as a central air conditioner.  Change or wash the filter monthly. Keep windows closed.  Do not use attic fans.   Encase the mattress, box springs and pillows with zippered, dust proof covers. Wash the bed linens in hot water weekly.   Remove carpet, especially from the bedroom. Remove stuffed animals, throw pillows, dust ruffles, heavy drapes and other items that collect dust from the bedroom. Do not use a humidifier.   Use wood, vinyl or leather furniture instead of cloth furniture in the bedroom. Keep the indoor humidity at 30 - 40%.  Monitor with a humidity gauge.  Molds - Indoor avoidance Use air conditioning to reduce indoor humidity.  Do not use a humidifier. Keep indoor humidity at 30 - 40%.  Use a dehumidifier if needed. In the bathroom use an exhaust fan or open a window after showering.  Wipe down damp surfaces after showering.  Clean bathrooms with a mold-killing solution (diluted bleach, or products like Tilex, etc) at least once a month. In the kitchen use an exhaust fan to remove steam from cooking.  Throw away spoiled foods immediately, and empty garbage daily.  Empty water pans below self-defrosting  refrigerators frequently. Vent the clothes dryer to the outside. Limit indoor houseplants; mold grows in the dirt.  No houseplants in the bedroom. Remove carpet from the bedroom. Encase the mattress and box springs with a zippered encasing.  Molds - Outdoor avoidance Avoid being outside when the grass is being mowed, or the ground is tilled. Avoid playing in leaves, pine straw, hay, etc.  Dead plant materials contain mold. Avoid going into barns or grain storage areas. Remove leaves, clippings and compost from around the home. Pollen Avoidance Pollen levels are highest during the mid-day and afternoon.  Consider this when planning outdoor activities. Avoid being outside when the grass is being mowed, or wear a mask if the pollen-allergic person must be the one to mow the grass. Keep the windows closed to keep pollen outside of the home. Use an air conditioner to filter the air. Take a shower, wash hair, and change clothing after working or playing outdoors during pollen season. Pet Dander Keep the pet out of your bedroom and restrict it to only a few rooms. Be advised that keeping the pet in only one room will  not limit the allergens to that room. Don't pet, hug or kiss the pet; if you do, wash your hands with soap and water. High-efficiency particulate air (HEPA) cleaners run continuously in a bedroom or living room can reduce allergen levels over time. Regular use of a high-efficiency vacuum cleaner or a central vacuum can reduce allergen levels. Giving your pet a bath at least once a week can reduce airborne allergen.    Return in about 6 weeks (around 03/09/2023).  Alesia Morin, MD [facility]

## 2023-01-26 NOTE — Patient Instructions (Addendum)
Allergic Rhinitis: - Due to turbinate hypertrophy, seasonal symptoms and unresponsive to over the counter meds, performed skin testing to identify aeroallergen triggers.   - Positive skin test 01/2023: grasses, weeds, trees, molds, dust mite, cats, feathers  - Avoidance measures discussed. - Use nasal saline rinses before nose sprays such as with Neilmed Sinus Rinse.  Use distilled water.   - Use Flonase 2 sprays each nostril daily. Aim upward and outward. - Use Azelastine 1-2 sprays each nostril twice daily as needed for runny nose, drainage, sneezing, congestion. Aim upward and outward. - Use Zyrtec 10 mg daily as needed for runny nose, sneezing, itchy watery eyes.   - Try to wean off Tylenol PM.   - Consider allergy shots as long term control of your symptoms by teaching your immune system to be more tolerant of your allergy triggers.  Please discuss with insurance company and if interested in starting, let us know.  Will need Epipen.  Also on shot days, please take Zyrtec.    Dry Mouth:  Water Sip water throughout the day.  Humidifier Using a humidifier once or twice a day, especially in your sleeping area at night, can help a lot. Cool or warm mist both work; use what you prefer. There are different kinds of humidifiers, including small, personal models, some with face masks and others that just direct steam in the air near you.  A Water Spray Bottle Keep a spray bottle with water nearby and spray inside your mouth when needed to keep it wet. Adding a few drops of aloe or glycerin to the water can make it last longer or extend the moisturizing effects.  Glycerin (also called glycerol) is an inexpensive, flavorless and nontoxic ingredient you can find at cake decorating stores and online. It is a humectant, which means it attracts and retains moisture. Put a few drops of glycerin in water, swish it around in your mouth and spit it out. Or, make an oral spray: Use four drops of glycerin in a  small spray bottle of water (4 ounces) and use as needed -- you don't have to spit it out. (Note: Do not put drops directly on tongue or in mouth. You must dilute them in water.)  Sugarless Candy, Lozenges or Gum Having something in the mouth can trigger natural saliva production. Citrus, cinnamon and mint are good flavor choices if they are not too acidic or irritating. Look for those with aloe, xylitol, glycerin or other hydrating agents and sugarless gum with baking soda, available at many local drugstores.  More Moisture at Mealtime Sip water between bites when eating. Adding condiments, soups, gravies and sauces can help, too.  Avoid Caffeine and Alcohol Caffeine and alcohol can cause excessive dryness since they are diuretics.  Alcohol-free Mouthwashes Alcohol can further dry out your mouth. Most kids' mouthwashes are alcohol-free, and many brands have an alcohol-free option.  Saliva Substitutes Ask your pharmacist about over-the-counter products that can come in drops or spray formulas.  Frozen Melon or Cucumber Try a refreshing, water-rich slice between your cheek and gum for one or two hours. If it helps, keep some thinly sliced in a small bag in the freezer and use one or more a day.   ALLERGEN AVOIDANCE MEASURES   Dust Mites Use central air conditioning and heat; and change the filter monthly.  Pleated filters work better than mesh filters.  Electrostatic filters may also be used; wash the filter monthly.  Window air conditioners may be used, but  do not clean the air as well as a central air conditioner.  Change or wash the filter monthly. Keep windows closed.  Do not use attic fans.   Encase the mattress, box springs and pillows with zippered, dust proof covers. Wash the bed linens in hot water weekly.   Remove carpet, especially from the bedroom. Remove stuffed animals, throw pillows, dust ruffles, heavy drapes and other items that collect dust from the bedroom. Do not  use a humidifier.   Use wood, vinyl or leather furniture instead of cloth furniture in the bedroom. Keep the indoor humidity at 30 - 40%.  Monitor with a humidity gauge.  Molds - Indoor avoidance Use air conditioning to reduce indoor humidity.  Do not use a humidifier. Keep indoor humidity at 30 - 40%.  Use a dehumidifier if needed. In the bathroom use an exhaust fan or open a window after showering.  Wipe down damp surfaces after showering.  Clean bathrooms with a mold-killing solution (diluted bleach, or products like Tilex, etc) at least once a month. In the kitchen use an exhaust fan to remove steam from cooking.  Throw away spoiled foods immediately, and empty garbage daily.  Empty water pans below self-defrosting refrigerators frequently. Vent the clothes dryer to the outside. Limit indoor houseplants; mold grows in the dirt.  No houseplants in the bedroom. Remove carpet from the bedroom. Encase the mattress and box springs with a zippered encasing.  Molds - Outdoor avoidance Avoid being outside when the grass is being mowed, or the ground is tilled. Avoid playing in leaves, pine straw, hay, etc.  Dead plant materials contain mold. Avoid going into barns or grain storage areas. Remove leaves, clippings and compost from around the home. Pollen Avoidance Pollen levels are highest during the mid-day and afternoon.  Consider this when planning outdoor activities. Avoid being outside when the grass is being mowed, or wear a mask if the pollen-allergic person must be the one to mow the grass. Keep the windows closed to keep pollen outside of the home. Use an air conditioner to filter the air. Take a shower, wash hair, and change clothing after working or playing outdoors during pollen season. Pet Dander Keep the pet out of your bedroom and restrict it to only a few rooms. Be advised that keeping the pet in only one room will not limit the allergens to that room. Don't pet, hug or kiss the  pet; if you do, wash your hands with soap and water. High-efficiency particulate air (HEPA) cleaners run continuously in a bedroom or living room can reduce allergen levels over time. Regular use of a high-efficiency vacuum cleaner or a central vacuum can reduce allergen levels. Giving your pet a bath at least once a week can reduce airborne allergen.

## 2023-01-30 ENCOUNTER — Encounter: Payer: Self-pay | Admitting: Internal Medicine

## 2023-01-30 ENCOUNTER — Telehealth: Payer: Self-pay

## 2023-01-30 ENCOUNTER — Other Ambulatory Visit (HOSPITAL_COMMUNITY): Payer: Self-pay

## 2023-01-30 NOTE — Telephone Encounter (Signed)
Pharmacy Patient Advocate Encounter   Received notification from CoverMyMeds that prior authorization for Fluticasone Propionate 50MCG/ACT suspension is required/requested.   Insurance verification completed.   The patient is insured through [facility] .   Per test claim: PA required; PA started via CoverMyMeds. KEY [id number] . Waiting for clinical questions to populate.

## 2023-01-30 NOTE — Telephone Encounter (Signed)
 Pharmacy Patient Advocate Encounter  Questions generated, answered, and submitted

## 2023-01-31 NOTE — Telephone Encounter (Signed)
Pharmacy Patient Advocate Encounter  Received notification from [facility] that Prior Authorization for Fluticasone Propionate 50MCG/ACT suspension has been DENIED. Please advise how you'd like to proceed. Full denial letter will be uploaded to the media tab. See denial reason below.  This health benefit plan does not cover the following services, supplies, drugs or charges: Any drug that is therapeutically equivalent to an over-the-counter drug where the over-the-counter products contain the same active ingredients as the prescription product at the same, or similar, strengths - OR- Drugs that are Purchased over-the-counter, unless specifically listed as a covered drug in the formulary and a written prescription is provided.  PA #/Case ID/Reference #: [id number]  Please be advised we currently do not have a Pharmacist to review denials, therefore you will need to process appeals accordingly as needed. Thanks for your support at this time. Contact for appeals are as follows: Phone: [phone], Fax: [phone]  Last day to appeal is within 180 days of denial (01-30-2023)

## 2023-02-09 ENCOUNTER — Other Ambulatory Visit: Payer: Self-pay | Admitting: *Deleted

## 2023-02-09 ENCOUNTER — Telehealth: Payer: Self-pay | Admitting: Internal Medicine

## 2023-02-09 MED ORDER — EPINEPHRINE 0.3 MG/0.3ML IJ SOAJ: 0.3000 mg | INTRAMUSCULAR | 1 refills | Status: DC | PRN

## 2023-02-09 NOTE — Telephone Encounter (Signed)
Prescription for EpiPen has been sent in. Called patient and informed. Patient verbalized understanding.

## 2023-02-09 NOTE — Telephone Encounter (Signed)
Patient dropped off signed consent forms to start allergy injections. Copies made - original given back to patient. Copied Consent forms placed in bulk scanning in [address].   Patient inquired about epipen. Advised patient she would need an epipen for the injections, patient is requesting it to be sent in to [facility] - [address] [place] [address]  Best contact number: [phone]

## 2023-02-13 ENCOUNTER — Other Ambulatory Visit: Payer: Self-pay | Admitting: Internal Medicine

## 2023-02-13 DIAGNOSIS — J301 Allergic rhinitis due to pollen: Secondary | ICD-10-CM

## 2023-02-13 DIAGNOSIS — J3081 Allergic rhinitis due to animal (cat) (dog) hair and dander: Secondary | ICD-10-CM

## 2023-02-13 DIAGNOSIS — J3089 Other allergic rhinitis: Secondary | ICD-10-CM

## 2023-02-13 NOTE — Progress Notes (Signed)
Aeroallergen Immunotherapy   Ordering Provider: Alesia Morin   Patient Details  Name: [name]  MRN: [id number]  Date of Birth: 1961-06-04   Order 1 of 2   Vial Label: TGW, DM, Cat   0.2 ml (Volume)  1:20 Concentration -- Johnson  0.3 ml (Volume)  1:20 Concentration -- Ragweed Mix  0.5 ml (Volume)  1:20 Concentration -- Eastern 10 Tree Mix (also Sweet Gum)  0.2 ml (Volume)  1:20 Concentration -- Box Elder  0.5 ml (Volume)  1:10 Concentration -- Cat Hair  0.5 ml (Volume)   AU Concentration -- Mite Mix (DF 5,000 & DP 5,000)    2.2  ml Extract Subtotal  2.8  ml Diluent  5.0  ml Maintenance Total   Schedule:  B  Silver Vial (1:1,000,000): Schedule B (6 doses)  Blue Vial (1:100,000): Schedule B (6 doses)  Yellow Vial (1:10,000): Schedule B (6 doses)  Green Vial (1:1,000): Schedule B (6 doses)  Red Vial (1:100): Schedule A (10 doses)   Special Instructions: Bring Epipen

## 2023-02-13 NOTE — Progress Notes (Signed)
AIT Rx signed.

## 2023-02-13 NOTE — Progress Notes (Signed)
MAKE VIALS CLOSER TO APPT.

## 2023-02-13 NOTE — Progress Notes (Signed)
Aeroallergen Immunotherapy   Ordering Provider: Alesia Morin   Patient Details  Name: [name]  MRN: [id number]  Date of Birth: 08/28/1960   Order 2 of 2   Vial Label: molds   0.2 ml (Volume)  1:20 Concentration -- Alternaria alternata  0.2 ml (Volume)  1:20 Concentration -- Cladosporium herbarum  0.2 ml (Volume)  1:10 Concentration -- Aspergillus mix  0.2 ml (Volume)  1:10 Concentration -- Penicillium mix  0.2 ml (Volume)  1:20 Concentration -- Drechslera spicifera  0.2 ml (Volume)  1:10 Concentration -- Fusarium moniliforme  0.2 ml (Volume)  1:40 Concentration -- Aureobasidium pullulans  0.2 ml (Volume)  1:10 Concentration -- Rhizopus oryzae  0.2 ml (Volume)  1:40 Concentration -- Epicoccum nigrum  0.2 ml (Volume)  1:40 Concentration -- Phoma betae    2  ml Extract Subtotal  3  ml Diluent  5.0  ml Maintenance Total   Schedule:  B  Silver Vial (1:1,000,000): Schedule B (6 doses)  Blue Vial (1:100,000): Schedule B (6 doses)  Yellow Vial (1:10,000): Schedule B (6 doses)  Green Vial (1:1,000): Schedule B (6 doses)  Red Vial (1:100): Schedule A (10 doses)   Special Instructions: Bring Epipen

## 2023-03-13 ENCOUNTER — Ambulatory Visit: Payer: BC Managed Care – PPO | Admitting: Internal Medicine

## 2023-03-13 ENCOUNTER — Encounter: Payer: Self-pay | Admitting: Internal Medicine

## 2023-03-13 ENCOUNTER — Other Ambulatory Visit: Payer: Self-pay

## 2023-03-13 VITALS — BP 112/66 | HR 68 | Temp 97.7°F | Wt 170.2 lb

## 2023-03-13 DIAGNOSIS — J3089 Other allergic rhinitis: Secondary | ICD-10-CM | POA: Diagnosis not present

## 2023-03-13 DIAGNOSIS — J302 Other seasonal allergic rhinitis: Secondary | ICD-10-CM

## 2023-03-13 DIAGNOSIS — R682 Dry mouth, unspecified: Secondary | ICD-10-CM

## 2023-03-13 MED ORDER — AZELASTINE HCL 0.1 % NA SOLN: 1.0000 | Freq: Two times a day (BID) | NASAL | 5 refills | Status: DC | PRN

## 2023-03-13 MED ORDER — FLUTICASONE PROPIONATE 50 MCG/ACT NA SUSP: 1.0000 | Freq: Every day | NASAL | 5 refills | Status: AC

## 2023-03-13 MED ORDER — CETIRIZINE HCL 10 MG PO TABS: 10.0000 mg | ORAL_TABLET | Freq: Every day | ORAL | 5 refills | Status: DC | PRN

## 2023-03-13 NOTE — Patient Instructions (Addendum)
Allergic Rhinitis: - Positive skin test 01/2023: grasses, weeds, trees, molds, dust mite, cats, feathers  - Avoidance measures discussed. - Use nasal saline rinses before nose sprays such as with Neilmed Sinus Rinse.  Use distilled water.   - Use Flonase 1-2 sprays each nostril daily. Aim upward and outward. - Use Azelastine 1-2 sprays each nostril twice daily as needed for runny nose, drainage, sneezing, congestion. Aim upward and outward. - Use Zyrtec 10 mg daily as needed for runny nose, sneezing, itchy watery eyes.  This can cause dryness.  Take Zyrtec on shot days  - Starting AIT 04/10/2023.  Remember to come weekly. Bring Epipen for shot visits.

## 2023-03-13 NOTE — Progress Notes (Signed)
   FOLLOW UP Date of Service/Encounter:  03/13/23   Subjective:  [name] (DOB: November 20, 1960) is a 62 y.o. female who [facility] for follow up for allergic rhinitis.   History obtained from: chart review and patient. Last visit was with me on 01/26/2023 and at the time was SPT positive to multiple allergens. Discussed use of Flonase, PRN Azelastine, PRN Zyrtec.  Has had trouble with dry mouth/dry skin- thought to be related to anti histamines so discussed considering AIT.   Since last visit, she reports doing okay.  Has not had much trouble with flare up of her allergies but does sometimes have sinus headaches, congestion, drainage.  Using Flonase and Azelastine daily; has not used Zyrtec.  Still having dry mouth.  She is starting AIT next month.   Past Medical History: History reviewed. No pertinent past medical history.  Objective:  BP 112/66   Pulse 68   Temp 97.7 F (36.5 C) (Temporal)   Wt 170 lb 3.2 oz (77.2 kg)   SpO2 98%   BMI 24.42 kg/m  Body mass index is 24.42 kg/m. Physical Exam: GEN: alert, well developed HEENT: clear conjunctiva, nose with mild inferior turbinate hypertrophy, pink nasal mucosa, no rhinorrhea, no cobblestoning HEART: regular rate and rhythm, no murmur LUNGS: clear to auscultation bilaterally, no coughing, unlabored respiration SKIN: no rashes or lesions  Assessment:   1. Seasonal and perennial allergic rhinitis   2. Dry mouth     Plan/Recommendations:  Allergic Rhinitis: - Positive skin test 01/2023: grasses, weeds, trees, molds, dust mite, cats, feathers  - Avoidance measures discussed. - Use nasal saline rinses before nose sprays such as with Neilmed Sinus Rinse.  Use distilled water.   - Use Flonase 1-2 sprays each nostril daily. Aim upward and outward. - Use Azelastine 1-2 sprays each nostril twice daily as needed for runny nose, drainage, sneezing, congestion. Aim upward and outward. -  Use Zyrtec 10 mg daily as needed for runny nose, sneezing, itchy watery eyes.  This can cause dryness.  Take Zyrtec on shot days  - Starting AIT 04/10/2023.  Remember to come weekly. Bring Epipen for shot visits.  Discussed hopefully we can get off nose sprays/anti histamines to help with the dry mouth.        Return in about 3 months (around 06/13/2023).  Alesia Morin, MD [facility]

## 2023-03-14 DIAGNOSIS — J3081 Allergic rhinitis due to animal (cat) (dog) hair and dander: Secondary | ICD-10-CM | POA: Diagnosis not present

## 2023-03-14 NOTE — Progress Notes (Signed)
VIALS EXP 03-13-24

## 2023-03-15 DIAGNOSIS — J302 Other seasonal allergic rhinitis: Secondary | ICD-10-CM | POA: Diagnosis not present

## 2023-03-23 NOTE — Progress Notes (Deleted)
Aeroallergen Immunotherapy   Ordering Provider: Alesia Morin   Patient Details  Name: [name]  MRN: [id number]  Date of Birth: 08/28/1960   Order 2 of 2   Vial Label: molds   0.2 ml (Volume)  1:20 Concentration -- Alternaria alternata  0.2 ml (Volume)  1:20 Concentration -- Cladosporium herbarum  0.2 ml (Volume)  1:10 Concentration -- Aspergillus mix  0.2 ml (Volume)  1:10 Concentration -- Penicillium mix  0.2 ml (Volume)  1:20 Concentration -- Drechslera spicifera  0.2 ml (Volume)  1:10 Concentration -- Fusarium moniliforme  0.2 ml (Volume)  1:40 Concentration -- Aureobasidium pullulans  0.2 ml (Volume)  1:10 Concentration -- Rhizopus oryzae  0.2 ml (Volume)  1:40 Concentration -- Epicoccum nigrum  0.2 ml (Volume)  1:40 Concentration -- Phoma betae    2  ml Extract Subtotal  3  ml Diluent  5.0  ml Maintenance Total   Schedule:  B  Silver Vial (1:1,000,000): Schedule B (6 doses)  Blue Vial (1:100,000): Schedule B (6 doses)  Yellow Vial (1:10,000): Schedule B (6 doses)  Green Vial (1:1,000): Schedule B (6 doses)  Red Vial (1:100): Schedule A (10 doses)   Special Instructions: Bring Epipen

## 2023-04-10 ENCOUNTER — Ambulatory Visit (INDEPENDENT_AMBULATORY_CARE_PROVIDER_SITE_OTHER): Payer: BC Managed Care – PPO | Admitting: *Deleted

## 2023-04-10 ENCOUNTER — Encounter: Payer: Self-pay | Admitting: Internal Medicine

## 2023-04-10 DIAGNOSIS — J309 Allergic rhinitis, unspecified: Secondary | ICD-10-CM | POA: Diagnosis not present

## 2023-04-10 NOTE — Progress Notes (Signed)
Immunotherapy   Patient Details  Name: [name] MRN: [id number] Date of Birth: 03-May-1961  04/10/2023  [name] started injections for  TGW-DM-CAT, MOLDS Following schedule: B  Frequency:1 time per week Epi-Pen:Epi-Pen Available  Consent signed and patient instructions given. Patient started allergy injections and received 0.57mL of TGW-DM_CAT in the RUA and 0.36mL of MOLDS in the LUA. Patient waited 30 minutes in office and did not experience any issues.    [name] 04/10/2023, 11:10 AM

## 2023-04-13 DIAGNOSIS — E559 Vitamin D deficiency, unspecified: Secondary | ICD-10-CM | POA: Diagnosis not present

## 2023-04-13 DIAGNOSIS — E039 Hypothyroidism, unspecified: Secondary | ICD-10-CM | POA: Diagnosis not present

## 2023-04-13 DIAGNOSIS — Z0000 Encounter for general adult medical examination without abnormal findings: Secondary | ICD-10-CM | POA: Diagnosis not present

## 2023-04-13 DIAGNOSIS — Z79899 Other long term (current) drug therapy: Secondary | ICD-10-CM | POA: Diagnosis not present

## 2023-04-13 DIAGNOSIS — E7800 Pure hypercholesterolemia, unspecified: Secondary | ICD-10-CM | POA: Diagnosis not present

## 2023-04-13 DIAGNOSIS — Z23 Encounter for immunization: Secondary | ICD-10-CM | POA: Diagnosis not present

## 2023-04-17 ENCOUNTER — Ambulatory Visit (INDEPENDENT_AMBULATORY_CARE_PROVIDER_SITE_OTHER): Payer: BC Managed Care – PPO | Admitting: *Deleted

## 2023-04-17 DIAGNOSIS — J309 Allergic rhinitis, unspecified: Secondary | ICD-10-CM | POA: Diagnosis not present

## 2023-04-24 ENCOUNTER — Ambulatory Visit (INDEPENDENT_AMBULATORY_CARE_PROVIDER_SITE_OTHER): Payer: BC Managed Care – PPO | Admitting: *Deleted

## 2023-04-24 DIAGNOSIS — J309 Allergic rhinitis, unspecified: Secondary | ICD-10-CM | POA: Diagnosis not present

## 2023-05-01 ENCOUNTER — Ambulatory Visit (INDEPENDENT_AMBULATORY_CARE_PROVIDER_SITE_OTHER): Payer: BC Managed Care – PPO

## 2023-05-01 DIAGNOSIS — J309 Allergic rhinitis, unspecified: Secondary | ICD-10-CM

## 2023-05-07 DIAGNOSIS — Z124 Encounter for screening for malignant neoplasm of cervix: Secondary | ICD-10-CM | POA: Diagnosis not present

## 2023-05-07 DIAGNOSIS — Z6824 Body mass index (BMI) 24.0-24.9, adult: Secondary | ICD-10-CM | POA: Diagnosis not present

## 2023-05-07 DIAGNOSIS — Z1151 Encounter for screening for human papillomavirus (HPV): Secondary | ICD-10-CM | POA: Diagnosis not present

## 2023-05-07 DIAGNOSIS — Z01419 Encounter for gynecological examination (general) (routine) without abnormal findings: Secondary | ICD-10-CM | POA: Diagnosis not present

## 2023-05-07 DIAGNOSIS — Z1231 Encounter for screening mammogram for malignant neoplasm of breast: Secondary | ICD-10-CM | POA: Diagnosis not present

## 2023-05-08 ENCOUNTER — Ambulatory Visit (INDEPENDENT_AMBULATORY_CARE_PROVIDER_SITE_OTHER): Payer: BC Managed Care – PPO | Admitting: *Deleted

## 2023-05-08 ENCOUNTER — Other Ambulatory Visit: Payer: Self-pay | Admitting: Obstetrics and Gynecology

## 2023-05-08 DIAGNOSIS — J309 Allergic rhinitis, unspecified: Secondary | ICD-10-CM

## 2023-05-08 DIAGNOSIS — Z803 Family history of malignant neoplasm of breast: Secondary | ICD-10-CM

## 2023-05-10 ENCOUNTER — Other Ambulatory Visit: Payer: Self-pay | Admitting: Obstetrics and Gynecology

## 2023-05-10 DIAGNOSIS — R928 Other abnormal and inconclusive findings on diagnostic imaging of breast: Secondary | ICD-10-CM

## 2023-05-15 ENCOUNTER — Ambulatory Visit (INDEPENDENT_AMBULATORY_CARE_PROVIDER_SITE_OTHER): Payer: BC Managed Care – PPO

## 2023-05-15 DIAGNOSIS — J309 Allergic rhinitis, unspecified: Secondary | ICD-10-CM

## 2023-05-19 ENCOUNTER — Ambulatory Visit: Payer: BC Managed Care – PPO

## 2023-05-19 ENCOUNTER — Ambulatory Visit
Admission: RE | Admit: 2023-05-19 | Discharge: 2023-05-19 | Disposition: A | Discharge status: Home / Self Care | Payer: BC Managed Care – PPO | Source: Ambulatory Visit | Attending: Obstetrics and Gynecology | Admitting: Obstetrics and Gynecology

## 2023-05-19 DIAGNOSIS — R928 Other abnormal and inconclusive findings on diagnostic imaging of breast: Secondary | ICD-10-CM

## 2023-05-22 ENCOUNTER — Ambulatory Visit (INDEPENDENT_AMBULATORY_CARE_PROVIDER_SITE_OTHER): Payer: Self-pay | Admitting: *Deleted

## 2023-05-22 DIAGNOSIS — J309 Allergic rhinitis, unspecified: Secondary | ICD-10-CM

## 2023-05-28 ENCOUNTER — Ambulatory Visit (INDEPENDENT_AMBULATORY_CARE_PROVIDER_SITE_OTHER): Payer: BC Managed Care – PPO

## 2023-05-28 DIAGNOSIS — J309 Allergic rhinitis, unspecified: Secondary | ICD-10-CM | POA: Diagnosis not present

## 2023-06-05 ENCOUNTER — Ambulatory Visit (INDEPENDENT_AMBULATORY_CARE_PROVIDER_SITE_OTHER): Payer: BC Managed Care – PPO | Admitting: *Deleted

## 2023-06-05 DIAGNOSIS — J309 Allergic rhinitis, unspecified: Secondary | ICD-10-CM | POA: Diagnosis not present

## 2023-06-12 ENCOUNTER — Ambulatory Visit (INDEPENDENT_AMBULATORY_CARE_PROVIDER_SITE_OTHER): Payer: BC Managed Care – PPO | Admitting: *Deleted

## 2023-06-12 DIAGNOSIS — J309 Allergic rhinitis, unspecified: Secondary | ICD-10-CM | POA: Diagnosis not present

## 2023-06-13 ENCOUNTER — Ambulatory Visit: Payer: BC Managed Care – PPO | Admitting: Internal Medicine

## 2023-06-13 ENCOUNTER — Encounter: Payer: Self-pay | Admitting: Internal Medicine

## 2023-06-13 VITALS — BP 108/64 | HR 72 | Temp 98.2°F | Wt 173.5 lb

## 2023-06-13 DIAGNOSIS — J3089 Other allergic rhinitis: Secondary | ICD-10-CM

## 2023-06-13 DIAGNOSIS — J302 Other seasonal allergic rhinitis: Secondary | ICD-10-CM

## 2023-06-13 DIAGNOSIS — R682 Dry mouth, unspecified: Secondary | ICD-10-CM

## 2023-06-13 MED ORDER — CETIRIZINE HCL 10 MG PO TABS: 10.0000 mg | ORAL_TABLET | Freq: Every day | ORAL | 5 refills | Status: DC | PRN

## 2023-06-13 MED ORDER — AZELASTINE HCL 0.1 % NA SOLN: 1.0000 | Freq: Two times a day (BID) | NASAL | 5 refills | Status: DC | PRN

## 2023-06-13 NOTE — Patient Instructions (Addendum)
 Allergic Rhinitis - Positive skin test 01/2023: grasses, weeds, trees, molds, dust mite, cats, feathers  - Avoidance measures discussed. - Use nasal saline rinses before nose sprays such as with Neilmed Sinus Rinse.  Use distilled water.   - Okay to use saline gel if nasal dryness.  - Use Flonase  1 spray each nostril every other day. Aim upward and outward. - Use Azelastine  1-2 sprays each nostril twice daily as needed for runny nose, drainage, sneezing. Aim upward and outward. - Use Zyrtec  10 mg daily as needed for runny nose, sneezing, itchy watery eyes.  Take Zyrtec  on shot days  - Started AIT 04/2023.  Remember to come weekly. Bring Epipen  for shot visits.   Dry Mouth - Can try xylitol discs/lozenges  - Can also use sugar free candy to help activate the salivary glands - Remember to drink lots of water throughout the day.

## 2023-06-13 NOTE — Progress Notes (Signed)
   FOLLOW UP Date of Service/Encounter:  06/13/23   Subjective:  [name] (DOB: 08/29/1960) is a 63 y.o. female who [facility] for follow up for allergic rhinitis.   History obtained from: chart review and patient. Last visit was with me on 10/8 and at the time, had discussed starting AIT. Has trouble with chronic dry mouth.   Since last visit, she reports first shot on 11/5.  Doing well with allergy  shots, denies issues with reactions. Has an Epipen . Has noted decrease in sinus headaches, sneezing, watery eyes. Still has dry mouth, worse at nighttime and thinks its related to mouth breathing but also sometimes has it during the day.  Drinks plenty of water. Using Flonase  daily, Azelastine  rarely, Zyrtec  only on shot days.   Past Medical History: History reviewed. No pertinent past medical history.  Objective:  BP 108/64 (BP Location: Left Arm, Patient Position: Sitting, Cuff Size: Normal)   Pulse 72   Temp 98.2 F (36.8 C) (Temporal)   Wt 173 lb 8 oz (78.7 kg)   SpO2 100%   BMI 24.89 kg/m  Body mass index is 24.89 kg/m. Physical Exam: GEN: alert, well developed HEENT: clear conjunctiva, nose with mild inferior turbinate hypertrophy, pink nasal mucosa, slight clear rhinorrhea, no cobblestoning HEART: regular rate and rhythm, no murmur LUNGS: clear to auscultation bilaterally, no coughing, unlabored respiration SKIN: no rashes or lesions  Assessment:   1. Seasonal and perennial allergic rhinitis   2. Dry mouth     Plan/Recommendations:  Allergic Rhinitis - Positive skin test 01/2023: grasses, weeds, trees, molds, dust mite, cats, feathers  - Avoidance measures discussed. - Use nasal saline rinses before nose sprays such as with Neilmed Sinus Rinse.  Use distilled water.   - Okay to use saline gel if nasal dryness.  - Use Flonase  1 spray each nostril every other day. Aim upward and outward. - Use Azelastine  1-2 sprays  each nostril twice daily as needed for runny nose, drainage, sneezing. Aim upward and outward. - Use Zyrtec  10 mg daily as needed for runny nose, sneezing, itchy watery eyes.  Take Zyrtec  on shot days  - Started AIT 04/2023.  Remember to come weekly. Bring Epipen  for shot visits.   Dry Mouth - Can try xylitol discs/lozenges  - Can also use sugar free candy to help activate the salivary glands - Remember to drink lots of water throughout the day.      Return in about 6 months (around 12/11/2023).  Arleta Blanch, MD [facility]

## 2023-06-17 ENCOUNTER — Other Ambulatory Visit: Payer: BC Managed Care – PPO

## 2023-06-19 ENCOUNTER — Ambulatory Visit (INDEPENDENT_AMBULATORY_CARE_PROVIDER_SITE_OTHER): Payer: BC Managed Care – PPO | Admitting: *Deleted

## 2023-06-19 DIAGNOSIS — J309 Allergic rhinitis, unspecified: Secondary | ICD-10-CM | POA: Diagnosis not present

## 2023-06-26 ENCOUNTER — Ambulatory Visit (INDEPENDENT_AMBULATORY_CARE_PROVIDER_SITE_OTHER): Payer: BC Managed Care – PPO | Admitting: *Deleted

## 2023-06-26 DIAGNOSIS — J309 Allergic rhinitis, unspecified: Secondary | ICD-10-CM

## 2023-07-02 DIAGNOSIS — Z13820 Encounter for screening for osteoporosis: Secondary | ICD-10-CM | POA: Diagnosis not present

## 2023-07-03 ENCOUNTER — Ambulatory Visit (INDEPENDENT_AMBULATORY_CARE_PROVIDER_SITE_OTHER): Payer: Self-pay | Admitting: *Deleted

## 2023-07-03 DIAGNOSIS — J309 Allergic rhinitis, unspecified: Secondary | ICD-10-CM

## 2023-07-10 ENCOUNTER — Ambulatory Visit (INDEPENDENT_AMBULATORY_CARE_PROVIDER_SITE_OTHER): Payer: Self-pay

## 2023-07-10 DIAGNOSIS — J309 Allergic rhinitis, unspecified: Secondary | ICD-10-CM

## 2023-07-16 ENCOUNTER — Ambulatory Visit (INDEPENDENT_AMBULATORY_CARE_PROVIDER_SITE_OTHER): Payer: Self-pay | Admitting: *Deleted

## 2023-07-16 DIAGNOSIS — J309 Allergic rhinitis, unspecified: Secondary | ICD-10-CM | POA: Diagnosis not present

## 2023-07-17 ENCOUNTER — Ambulatory Visit
Admission: RE | Admit: 2023-07-17 | Discharge: 2023-07-17 | Disposition: A | Discharge status: Home / Self Care | Payer: BC Managed Care – PPO | Source: Ambulatory Visit | Attending: Obstetrics and Gynecology | Admitting: Obstetrics and Gynecology

## 2023-07-17 DIAGNOSIS — Z803 Family history of malignant neoplasm of breast: Secondary | ICD-10-CM

## 2023-07-17 DIAGNOSIS — Z1231 Encounter for screening mammogram for malignant neoplasm of breast: Secondary | ICD-10-CM | POA: Diagnosis not present

## 2023-07-17 MED ORDER — GADOPICLENOL 0.5 MMOL/ML IV SOLN
8.0000 mL | Freq: Once | INTRAVENOUS | Status: AC | PRN
Start: 2023-07-17 — End: 2023-07-17
  Administered 2023-07-17: 8 mL via INTRAVENOUS

## 2023-07-24 ENCOUNTER — Ambulatory Visit (INDEPENDENT_AMBULATORY_CARE_PROVIDER_SITE_OTHER): Payer: Self-pay | Admitting: *Deleted

## 2023-07-24 DIAGNOSIS — J309 Allergic rhinitis, unspecified: Secondary | ICD-10-CM

## 2023-08-06 ENCOUNTER — Ambulatory Visit (INDEPENDENT_AMBULATORY_CARE_PROVIDER_SITE_OTHER): Payer: Self-pay | Admitting: *Deleted

## 2023-08-06 DIAGNOSIS — J309 Allergic rhinitis, unspecified: Secondary | ICD-10-CM | POA: Diagnosis not present

## 2023-08-13 ENCOUNTER — Ambulatory Visit (INDEPENDENT_AMBULATORY_CARE_PROVIDER_SITE_OTHER): Payer: Self-pay

## 2023-08-13 DIAGNOSIS — J309 Allergic rhinitis, unspecified: Secondary | ICD-10-CM | POA: Diagnosis not present

## 2023-08-15 DIAGNOSIS — H53143 Visual discomfort, bilateral: Secondary | ICD-10-CM | POA: Diagnosis not present

## 2023-08-15 DIAGNOSIS — H04123 Dry eye syndrome of bilateral lacrimal glands: Secondary | ICD-10-CM | POA: Diagnosis not present

## 2023-08-15 DIAGNOSIS — H25013 Cortical age-related cataract, bilateral: Secondary | ICD-10-CM | POA: Diagnosis not present

## 2023-08-21 ENCOUNTER — Ambulatory Visit (INDEPENDENT_AMBULATORY_CARE_PROVIDER_SITE_OTHER): Payer: Self-pay | Admitting: *Deleted

## 2023-08-21 DIAGNOSIS — J309 Allergic rhinitis, unspecified: Secondary | ICD-10-CM

## 2023-08-28 ENCOUNTER — Ambulatory Visit (INDEPENDENT_AMBULATORY_CARE_PROVIDER_SITE_OTHER): Payer: Self-pay | Admitting: *Deleted

## 2023-08-28 DIAGNOSIS — J309 Allergic rhinitis, unspecified: Secondary | ICD-10-CM

## 2023-09-04 ENCOUNTER — Ambulatory Visit (INDEPENDENT_AMBULATORY_CARE_PROVIDER_SITE_OTHER): Payer: Self-pay | Admitting: *Deleted

## 2023-09-04 DIAGNOSIS — J309 Allergic rhinitis, unspecified: Secondary | ICD-10-CM

## 2023-09-10 ENCOUNTER — Ambulatory Visit (INDEPENDENT_AMBULATORY_CARE_PROVIDER_SITE_OTHER): Payer: Self-pay

## 2023-09-10 DIAGNOSIS — J309 Allergic rhinitis, unspecified: Secondary | ICD-10-CM | POA: Diagnosis not present

## 2023-09-11 DIAGNOSIS — R0683 Snoring: Secondary | ICD-10-CM | POA: Diagnosis not present

## 2023-09-20 ENCOUNTER — Ambulatory Visit (INDEPENDENT_AMBULATORY_CARE_PROVIDER_SITE_OTHER): Payer: Self-pay

## 2023-09-20 DIAGNOSIS — J309 Allergic rhinitis, unspecified: Secondary | ICD-10-CM | POA: Diagnosis not present

## 2023-09-25 ENCOUNTER — Ambulatory Visit (INDEPENDENT_AMBULATORY_CARE_PROVIDER_SITE_OTHER)

## 2023-09-25 DIAGNOSIS — J309 Allergic rhinitis, unspecified: Secondary | ICD-10-CM | POA: Diagnosis not present

## 2023-10-03 DIAGNOSIS — G4733 Obstructive sleep apnea (adult) (pediatric): Secondary | ICD-10-CM | POA: Diagnosis not present

## 2023-10-04 ENCOUNTER — Ambulatory Visit (INDEPENDENT_AMBULATORY_CARE_PROVIDER_SITE_OTHER): Payer: Self-pay

## 2023-10-04 DIAGNOSIS — J309 Allergic rhinitis, unspecified: Secondary | ICD-10-CM

## 2023-10-09 ENCOUNTER — Ambulatory Visit (INDEPENDENT_AMBULATORY_CARE_PROVIDER_SITE_OTHER): Payer: Self-pay

## 2023-10-09 DIAGNOSIS — J309 Allergic rhinitis, unspecified: Secondary | ICD-10-CM | POA: Diagnosis not present

## 2023-10-10 DIAGNOSIS — G4733 Obstructive sleep apnea (adult) (pediatric): Secondary | ICD-10-CM | POA: Diagnosis not present

## 2023-10-16 ENCOUNTER — Ambulatory Visit (INDEPENDENT_AMBULATORY_CARE_PROVIDER_SITE_OTHER): Payer: Self-pay

## 2023-10-16 DIAGNOSIS — J309 Allergic rhinitis, unspecified: Secondary | ICD-10-CM

## 2023-10-23 ENCOUNTER — Ambulatory Visit (INDEPENDENT_AMBULATORY_CARE_PROVIDER_SITE_OTHER)

## 2023-10-23 DIAGNOSIS — J309 Allergic rhinitis, unspecified: Secondary | ICD-10-CM

## 2023-10-26 DIAGNOSIS — G4733 Obstructive sleep apnea (adult) (pediatric): Secondary | ICD-10-CM | POA: Diagnosis not present

## 2023-10-31 ENCOUNTER — Ambulatory Visit (INDEPENDENT_AMBULATORY_CARE_PROVIDER_SITE_OTHER): Payer: Self-pay

## 2023-10-31 DIAGNOSIS — J309 Allergic rhinitis, unspecified: Secondary | ICD-10-CM | POA: Diagnosis not present

## 2023-11-05 ENCOUNTER — Ambulatory Visit (INDEPENDENT_AMBULATORY_CARE_PROVIDER_SITE_OTHER): Payer: Self-pay

## 2023-11-05 DIAGNOSIS — J309 Allergic rhinitis, unspecified: Secondary | ICD-10-CM | POA: Diagnosis not present

## 2023-11-12 ENCOUNTER — Ambulatory Visit (INDEPENDENT_AMBULATORY_CARE_PROVIDER_SITE_OTHER): Payer: Self-pay

## 2023-11-12 DIAGNOSIS — J309 Allergic rhinitis, unspecified: Secondary | ICD-10-CM | POA: Diagnosis not present

## 2023-11-19 DIAGNOSIS — G4733 Obstructive sleep apnea (adult) (pediatric): Secondary | ICD-10-CM | POA: Diagnosis not present

## 2023-11-20 ENCOUNTER — Ambulatory Visit (INDEPENDENT_AMBULATORY_CARE_PROVIDER_SITE_OTHER)

## 2023-11-20 DIAGNOSIS — J309 Allergic rhinitis, unspecified: Secondary | ICD-10-CM

## 2023-11-26 ENCOUNTER — Ambulatory Visit (INDEPENDENT_AMBULATORY_CARE_PROVIDER_SITE_OTHER): Payer: Self-pay

## 2023-11-26 DIAGNOSIS — J309 Allergic rhinitis, unspecified: Secondary | ICD-10-CM | POA: Diagnosis not present

## 2023-11-26 DIAGNOSIS — G4733 Obstructive sleep apnea (adult) (pediatric): Secondary | ICD-10-CM | POA: Diagnosis not present

## 2023-12-03 ENCOUNTER — Ambulatory Visit (INDEPENDENT_AMBULATORY_CARE_PROVIDER_SITE_OTHER)

## 2023-12-03 DIAGNOSIS — J309 Allergic rhinitis, unspecified: Secondary | ICD-10-CM | POA: Diagnosis not present

## 2023-12-11 ENCOUNTER — Ambulatory Visit: Payer: BC Managed Care – PPO | Admitting: Internal Medicine

## 2023-12-18 ENCOUNTER — Ambulatory Visit (INDEPENDENT_AMBULATORY_CARE_PROVIDER_SITE_OTHER)

## 2023-12-18 DIAGNOSIS — L814 Other melanin hyperpigmentation: Secondary | ICD-10-CM | POA: Diagnosis not present

## 2023-12-18 DIAGNOSIS — Z85828 Personal history of other malignant neoplasm of skin: Secondary | ICD-10-CM | POA: Diagnosis not present

## 2023-12-18 DIAGNOSIS — J309 Allergic rhinitis, unspecified: Secondary | ICD-10-CM | POA: Diagnosis not present

## 2023-12-18 DIAGNOSIS — L821 Other seborrheic keratosis: Secondary | ICD-10-CM | POA: Diagnosis not present

## 2023-12-18 DIAGNOSIS — B078 Other viral warts: Secondary | ICD-10-CM | POA: Diagnosis not present

## 2023-12-18 DIAGNOSIS — D2272 Melanocytic nevi of left lower limb, including hip: Secondary | ICD-10-CM | POA: Diagnosis not present

## 2023-12-26 DIAGNOSIS — G4733 Obstructive sleep apnea (adult) (pediatric): Secondary | ICD-10-CM | POA: Diagnosis not present

## 2023-12-31 ENCOUNTER — Ambulatory Visit (INDEPENDENT_AMBULATORY_CARE_PROVIDER_SITE_OTHER)

## 2023-12-31 DIAGNOSIS — J309 Allergic rhinitis, unspecified: Secondary | ICD-10-CM | POA: Diagnosis not present

## 2024-01-03 DIAGNOSIS — J3089 Other allergic rhinitis: Secondary | ICD-10-CM | POA: Diagnosis not present

## 2024-01-03 NOTE — Progress Notes (Signed)
 VIALS MADE ON 01/03/24

## 2024-01-04 DIAGNOSIS — J302 Other seasonal allergic rhinitis: Secondary | ICD-10-CM | POA: Diagnosis not present

## 2024-01-14 ENCOUNTER — Ambulatory Visit (INDEPENDENT_AMBULATORY_CARE_PROVIDER_SITE_OTHER)

## 2024-01-14 DIAGNOSIS — J309 Allergic rhinitis, unspecified: Secondary | ICD-10-CM | POA: Diagnosis not present

## 2024-01-16 DIAGNOSIS — G4733 Obstructive sleep apnea (adult) (pediatric): Secondary | ICD-10-CM | POA: Diagnosis not present

## 2024-01-26 DIAGNOSIS — G4733 Obstructive sleep apnea (adult) (pediatric): Secondary | ICD-10-CM | POA: Diagnosis not present

## 2024-01-28 ENCOUNTER — Ambulatory Visit (INDEPENDENT_AMBULATORY_CARE_PROVIDER_SITE_OTHER)

## 2024-01-28 DIAGNOSIS — J309 Allergic rhinitis, unspecified: Secondary | ICD-10-CM | POA: Diagnosis not present

## 2024-02-05 ENCOUNTER — Ambulatory Visit (INDEPENDENT_AMBULATORY_CARE_PROVIDER_SITE_OTHER)

## 2024-02-05 DIAGNOSIS — J309 Allergic rhinitis, unspecified: Secondary | ICD-10-CM | POA: Diagnosis not present

## 2024-02-14 ENCOUNTER — Ambulatory Visit (INDEPENDENT_AMBULATORY_CARE_PROVIDER_SITE_OTHER)

## 2024-02-14 DIAGNOSIS — J309 Allergic rhinitis, unspecified: Secondary | ICD-10-CM | POA: Diagnosis not present

## 2024-02-19 ENCOUNTER — Ambulatory Visit (INDEPENDENT_AMBULATORY_CARE_PROVIDER_SITE_OTHER): Admitting: Internal Medicine

## 2024-02-19 ENCOUNTER — Other Ambulatory Visit: Payer: Self-pay

## 2024-02-19 VITALS — BP 110/72 | HR 65 | Temp 97.9°F | Resp 18 | Ht 70.0 in | Wt 166.3 lb

## 2024-02-19 DIAGNOSIS — R682 Dry mouth, unspecified: Secondary | ICD-10-CM | POA: Diagnosis not present

## 2024-02-19 DIAGNOSIS — J3089 Other allergic rhinitis: Secondary | ICD-10-CM

## 2024-02-19 DIAGNOSIS — L853 Xerosis cutis: Secondary | ICD-10-CM

## 2024-02-19 DIAGNOSIS — J343 Hypertrophy of nasal turbinates: Secondary | ICD-10-CM

## 2024-02-19 DIAGNOSIS — J302 Other seasonal allergic rhinitis: Secondary | ICD-10-CM

## 2024-02-19 MED ORDER — AZELASTINE HCL 0.1 % NA SOLN: 1.0000 | Freq: Two times a day (BID) | NASAL | 5 refills | Status: AC | PRN

## 2024-02-19 MED ORDER — EPINEPHRINE 0.3 MG/0.3ML IJ SOAJ: 0.3000 mg | INTRAMUSCULAR | 1 refills | Status: AC | PRN

## 2024-02-19 MED ORDER — CETIRIZINE HCL 10 MG PO TABS: 10.0000 mg | ORAL_TABLET | Freq: Every day | ORAL | 5 refills | Status: AC | PRN

## 2024-02-19 NOTE — Progress Notes (Signed)
   FOLLOW UP Date of Service/Encounter:  02/19/24   Subjective:  [name] (DOB: [birth year]) is a 63 y.o. female who [facility] for follow up for allergic rhinitis and dry mouth/skin.    History obtained from: chart review and patient. Last visit was with me on 06/13/2023 and at the time, was building up on AIT.  Also on Flonase , Azelastine , Zyrtec  for allergies.   Doing well on AIT. Has noted improvement in congestion, drainage.  Does note some runny nose especially when she goes on walks in the morning in cooler weather.  Also with some headaches.  Using Zyrtec  and Azelastine  PRN.  Not using Flonase .  Has been able to stop cold medications that she was taking OTC also.  Notes improvement in dry mouth especially after she changed her CPAP mask.  Still has some trouble with it.  Also reports dry skin, using lotion to moisturize.   Past Medical History: No past medical history on file.  Objective:  BP 110/72 (BP Location: Left Arm, Patient Position: Sitting, Cuff Size: Normal)   Pulse 65   Temp 97.9 F (36.6 C) (Temporal)   Resp 18   Ht 5' 10 (1.778 m)   Wt 166 lb 4.8 oz (75.4 kg)   SpO2 100%   BMI 23.86 kg/m  Body mass index is 23.86 kg/m. Physical Exam: GEN: alert, well developed HEENT: clear conjunctiva, nose with mild inferior turbinate hypertrophy, pink nasal mucosa, no rhinorrhea, slight cobblestoning HEART: regular rate and rhythm, no murmur LUNGS: clear to auscultation bilaterally, no coughing, unlabored respiration SKIN: no rashes or lesions  Assessment:   1. Seasonal and perennial allergic rhinitis   2. Nasal turbinate hypertrophy   3. Dry mouth   4. Dry skin     Plan/Recommendations:  Allergic Rhinitis - Improved, continue AIT.  - Positive skin test 01/2023: grasses, weeds, trees, molds, dust mite, cats, feathers  - Use nasal saline rinses before nose sprays such as with Neilmed Sinus Rinse.  Use distilled  water.   - Okay to use saline gel if nasal dryness.  - Use Azelastine  2 sprays each nostril twice daily as needed for runny nose, drainage, sneezing. Aim upward and outward. - Use Zyrtec  10 mg daily as needed for runny nose, sneezing, itchy watery eyes.  Take Zyrtec  on shot days  - Started AIT 04/2023.  Red vial reached 10/2023. Bring Epipen  for shot visits.   Dry Mouth/Skin:  - Improved with change in CPAP mask.   - Can try xylitol discs/lozenges  - Can also use sugar free candy to help activate the salivary glands - Remember to drink lots of water throughout the day.   - Apply a gentle, unscented moisturizer cream or ointment (Cerave, Cetaphil, Eucerin, Aveeno, Aquaphor, Vanicream, Vaseline)  all over while still damp.     Return in about 1 year (around 02/18/2025).  Arleta Blanch, MD [facility]

## 2024-02-19 NOTE — Patient Instructions (Addendum)
 Allergic Rhinitis - Positive skin test 01/2023: grasses, weeds, trees, molds, dust mite, cats, feathers  - Use nasal saline rinses before nose sprays such as with Neilmed Sinus Rinse.  Use distilled water.   - Okay to use saline gel if nasal dryness.  - Use Azelastine  2 sprays each nostril twice daily as needed for runny nose, drainage, sneezing. Aim upward and outward. - Use Zyrtec  10 mg daily as needed for runny nose, sneezing, itchy watery eyes.  Take Zyrtec  on shot days  - Started AIT 04/2023.  Red vial reached 10/2023. Bring Epipen  for shot visits.   Dry Mouth/Skin:  - Improved with change in CPAP mask.   - Can try xylitol discs/lozenges  - Can also use sugar free candy to help activate the salivary glands - Remember to drink lots of water throughout the day.   - Apply a gentle, unscented moisturizer cream or ointment (Cerave, Cetaphil, Eucerin, Aveeno, Aquaphor, Vanicream, Vaseline)  all over while still damp.

## 2024-03-11 ENCOUNTER — Ambulatory Visit (INDEPENDENT_AMBULATORY_CARE_PROVIDER_SITE_OTHER)

## 2024-03-11 DIAGNOSIS — J309 Allergic rhinitis, unspecified: Secondary | ICD-10-CM | POA: Diagnosis not present

## 2024-04-07 ENCOUNTER — Ambulatory Visit (INDEPENDENT_AMBULATORY_CARE_PROVIDER_SITE_OTHER)

## 2024-04-07 DIAGNOSIS — J309 Allergic rhinitis, unspecified: Secondary | ICD-10-CM

## 2024-04-24 DIAGNOSIS — E559 Vitamin D deficiency, unspecified: Secondary | ICD-10-CM | POA: Diagnosis not present

## 2024-04-24 DIAGNOSIS — Z0000 Encounter for general adult medical examination without abnormal findings: Secondary | ICD-10-CM | POA: Diagnosis not present

## 2024-04-24 DIAGNOSIS — Z79899 Other long term (current) drug therapy: Secondary | ICD-10-CM | POA: Diagnosis not present

## 2024-04-24 DIAGNOSIS — E039 Hypothyroidism, unspecified: Secondary | ICD-10-CM | POA: Diagnosis not present

## 2024-04-24 DIAGNOSIS — Z23 Encounter for immunization: Secondary | ICD-10-CM | POA: Diagnosis not present

## 2024-04-24 DIAGNOSIS — E7800 Pure hypercholesterolemia, unspecified: Secondary | ICD-10-CM | POA: Diagnosis not present

## 2024-05-06 ENCOUNTER — Ambulatory Visit

## 2024-05-06 DIAGNOSIS — J309 Allergic rhinitis, unspecified: Secondary | ICD-10-CM | POA: Diagnosis not present

## 2024-05-23 DIAGNOSIS — D72819 Decreased white blood cell count, unspecified: Secondary | ICD-10-CM | POA: Diagnosis not present

## 2024-06-03 ENCOUNTER — Ambulatory Visit (INDEPENDENT_AMBULATORY_CARE_PROVIDER_SITE_OTHER)

## 2024-06-03 DIAGNOSIS — J309 Allergic rhinitis, unspecified: Secondary | ICD-10-CM

## 2024-06-26 ENCOUNTER — Other Ambulatory Visit: Payer: Self-pay | Admitting: Obstetrics and Gynecology

## 2024-06-26 DIAGNOSIS — Z803 Family history of malignant neoplasm of breast: Secondary | ICD-10-CM

## 2024-07-01 ENCOUNTER — Ambulatory Visit

## 2024-07-01 DIAGNOSIS — J302 Other seasonal allergic rhinitis: Secondary | ICD-10-CM | POA: Diagnosis not present

## 2024-07-27 ENCOUNTER — Other Ambulatory Visit
